# Patient Record
Sex: Male | Born: 1955 | Race: White | Hispanic: No | Marital: Married | Smoking: Never smoker
Health system: Southern US, Community
[De-identification: ages and names within clinical notes are randomized; demographics above are authoritative.]

## PROBLEM LIST (undated history)

## (undated) DIAGNOSIS — S0990XA Unspecified injury of head, initial encounter: Secondary | ICD-10-CM

## (undated) DIAGNOSIS — T8859XA Other complications of anesthesia, initial encounter: Secondary | ICD-10-CM

## (undated) DIAGNOSIS — J45909 Unspecified asthma, uncomplicated: Secondary | ICD-10-CM

## (undated) DIAGNOSIS — F419 Anxiety disorder, unspecified: Secondary | ICD-10-CM

## (undated) DIAGNOSIS — H8110 Benign paroxysmal vertigo, unspecified ear: Secondary | ICD-10-CM

## (undated) DIAGNOSIS — K219 Gastro-esophageal reflux disease without esophagitis: Secondary | ICD-10-CM

## (undated) DIAGNOSIS — Z87442 Personal history of urinary calculi: Secondary | ICD-10-CM

## (undated) DIAGNOSIS — I10 Essential (primary) hypertension: Secondary | ICD-10-CM

## (undated) DIAGNOSIS — L309 Dermatitis, unspecified: Secondary | ICD-10-CM

## (undated) DIAGNOSIS — M170 Bilateral primary osteoarthritis of knee: Secondary | ICD-10-CM

## (undated) DIAGNOSIS — E7800 Pure hypercholesterolemia, unspecified: Secondary | ICD-10-CM

## (undated) DIAGNOSIS — N529 Male erectile dysfunction, unspecified: Secondary | ICD-10-CM

## (undated) DIAGNOSIS — R413 Other amnesia: Secondary | ICD-10-CM

## (undated) DIAGNOSIS — J189 Pneumonia, unspecified organism: Secondary | ICD-10-CM

## (undated) DIAGNOSIS — D75839 Thrombocytosis, unspecified: Secondary | ICD-10-CM

## (undated) DIAGNOSIS — G4700 Insomnia, unspecified: Secondary | ICD-10-CM

## (undated) DIAGNOSIS — N400 Enlarged prostate without lower urinary tract symptoms: Secondary | ICD-10-CM

## (undated) HISTORY — PX: CHOLECYSTECTOMY: SHX55

## (undated) HISTORY — PX: KNEE SURGERY: SHX244

---

## 2020-07-26 ENCOUNTER — Encounter (HOSPITAL_COMMUNITY): Payer: Self-pay

## 2020-07-26 NOTE — Progress Notes (Addendum)
COVID Vaccine Completed:  x2 Date COVID Vaccine completed:  04-22-20 & 05-13-20 COVID vaccine manufacturer: Pfizer    Moderna   [employer]   PCP - Lynn Ito, MD Cardiologist -   Chest x-ray -  EKG -  Stress Test -  ECHO -  Cardiac Cath -  Pacemaker/ICD device last checked:  Sleep Study -  CPAP -   Fasting Blood Sugar -  Checks Blood Sugar _____ times a day  Blood Thinner Instructions: Aspirin Instructions:  ASA 81 mg Last Dose:  Anesthesia review:   Patient denies shortness of breath, fever, cough and chest pain at PAT appointment.  Patient able to climb a flight of stairs and perform ADL's without assistance.  Patient currently being treated for poison ivy, has red rash on bilateral arms.   Patient verbalized understanding of instructions that were given to them at the PAT appointment. Patient was also instructed that they will need to review over the PAT instructions again at home before surgery.

## 2020-07-26 NOTE — Patient Instructions (Addendum)
DUE TO COVID-19 ONLY ONE VISITOR IS ALLOWED TO COME WITH YOU AND STAY IN THE WAITING ROOM ONLY DURING PRE OP AND PROCEDURE.   IF YOU WILL BE ADMITTED INTO THE HOSPITAL YOU ARE ALLOWED ONE SUPPORT PERSON DURING VISITATION HOURS ONLY (10AM -8PM)   . The support person may change daily. . The support person must pass our screening, gel in and out, and wear a mask at all times, including in the patient's room. . Patients must also wear a mask when staff or their support person are in the room.   COVID SWAB TESTING MUST BE COMPLETED ON:  Thursday, 08-03-20 @ 9:00 AM   [address] [place], [place] [address]  (Must self quarantine after testing. Follow instructions on handout.)        Your procedure is scheduled on:  Monday, 08-07-20   Report to [facility] Main  Entrance    Report to admitting at 9:00 AM   Call this number if you have problems the morning of surgery [phone]   Do not eat food :After Midnight.   May have liquids until 8:30 AM day of surgery  CLEAR LIQUID DIET  Foods Allowed                                                                     Foods Excluded  Water, Black Coffee and tea, regular and decaf             liquids that you cannot  Plain Jell-O in any flavor  (No red)                                   see through such as: Fruit ices (not with fruit pulp)                                      milk, soups, orange juice              Iced Popsicles (No red)                                      All solid food                                   Apple juices Sports drinks like Gatorade (No red) Lightly seasoned clear broth or consume(fat free) Sugar, honey syrup    Complete one Ensure drink the morning of surgery at 8:30 AM   the day of surgery.    Oral Hygiene is also important to reduce your risk of infection.                                    Remember - BRUSH YOUR TEETH THE MORNING OF SURGERY WITH YOUR REGULAR TOOTHPASTE   Do NOT smoke after  Midnight   Take these medicines the morning of  surgery with A SIP OF WATER:  Gabapentin, Claritin, Ativan if needed.  Okay to use inhalers and             bring with you day of surgery.                               You may not have any metal on your body including jewelry, and body piercings             Do not wear  lotions, powders, perfumes/cologne, or deodorant             Men may shave face and neck.   Do not bring valuables to the hospital. [facility] IS NOT RESPONSIBLE   FOR VALUABLES.   Contacts, dentures or bridgework may not be worn into surgery.   Bring small overnight bag day of surgery.     Special Instructions: Bring a copy of your healthcare power of attorney and living will documents         the day of surgery if you haven't scanned them in before.              Please read over the following fact sheets you were given: IF YOU HAVE QUESTIONS ABOUT YOUR PRE OP INSTRUCTIONS PLEASE CALL [phone]   [facility] - Preparing for Surgery Before surgery, you can play an important role.  Because skin is not sterile, your skin needs to be as free of germs as possible.  You can reduce the number of germs on your skin by washing with CHG (chlorahexidine gluconate) soap before surgery.  CHG is an antiseptic cleaner which kills germs and bonds with the skin to continue killing germs even after washing. Please DO NOT use if you have an allergy to CHG or antibacterial soaps.  If your skin becomes reddened/irritated stop using the CHG and inform your nurse when you arrive at Short Stay. Do not shave (including legs and underarms) for at least 48 hours prior to the first CHG shower.  You may shave your face/neck.  Please follow these instructions carefully:  1.  Shower with CHG Soap the night before surgery and the  morning of surgery.  2.  If you choose to wash your hair, wash your hair first as usual with your normal  shampoo.  3.  After you shampoo, rinse your hair and body thoroughly  to remove the shampoo.                             4.  Use CHG as you would any other liquid soap.  You can apply chg directly to the skin and wash.  Gently with a scrungie or clean washcloth.  5.  Apply the CHG Soap to your body ONLY FROM THE NECK DOWN.   Do   not use on face/ open                           Wound or open sores. Avoid contact with eyes, ears mouth and   genitals (private parts).                       Wash face,  Genitals (private parts) with your normal soap.             6.  Wash thoroughly, paying special attention  to the area where your    surgery  will be performed.  7.  Thoroughly rinse your body with warm water from the neck down.  8.  DO NOT shower/wash with your normal soap after using and rinsing off the CHG Soap.                9.  Pat yourself dry with a clean towel.            10.  Wear clean pajamas.            11.  Place clean sheets on your bed the night of your first shower and do not  sleep with pets. Day of Surgery : Do not apply any lotions/deodorants the morning of surgery.  Please wear clean clothes to the hospital/surgery center.  FAILURE TO FOLLOW THESE INSTRUCTIONS MAY RESULT IN THE CANCELLATION OF YOUR SURGERY  PATIENT SIGNATURE_________________________________  NURSE SIGNATURE__________________________________  ________________________________________________________________________   Howard Morton  An incentive spirometer is a tool that can help keep your lungs clear and active. This tool measures how well you are filling your lungs with each breath. Taking long deep breaths may help reverse or decrease the chance of developing breathing (pulmonary) problems (especially infection) following:  A long period of time when you are unable to move or be active. BEFORE THE PROCEDURE   If the spirometer includes an indicator to show your best effort, your nurse or respiratory therapist will set it to a desired goal.  If possible, sit up  straight or lean slightly forward. Try not to slouch.  Hold the incentive spirometer in an upright position. INSTRUCTIONS FOR USE  1. Sit on the edge of your bed if possible, or sit up as far as you can in bed or on a chair. 2. Hold the incentive spirometer in an upright position. 3. Breathe out normally. 4. Place the mouthpiece in your mouth and seal your lips tightly around it. 5. Breathe in slowly and as deeply as possible, raising the piston or the ball toward the top of the column. 6. Hold your breath for 3-5 seconds or for as long as possible. Allow the piston or ball to fall to the bottom of the column. 7. Remove the mouthpiece from your mouth and breathe out normally. 8. Rest for a few seconds and repeat Steps 1 through 7 at least 10 times every 1-2 hours when you are awake. Take your time and take a few normal breaths between deep breaths. 9. The spirometer may include an indicator to show your best effort. Use the indicator as a goal to work toward during each repetition. 10. After each set of 10 deep breaths, practice coughing to be sure your lungs are clear. If you have an incision (the cut made at the time of surgery), support your incision when coughing by placing a pillow or rolled up towels firmly against it. Once you are able to get out of bed, walk around indoors and cough well. You may stop using the incentive spirometer when instructed by your caregiver.  RISKS AND COMPLICATIONS  Take your time so you do not get dizzy or light-headed.  If you are in pain, you may need to take or ask for pain medication before doing incentive spirometry. It is harder to take a deep breath if you are having pain. AFTER USE  Rest and breathe slowly and easily.  It can be helpful to keep track of a log of your progress. Your caregiver can  provide you with a simple table to help with this. If you are using the spirometer at home, follow these instructions: SEEK MEDICAL CARE IF:   You are  having difficultly using the spirometer.  You have trouble using the spirometer as often as instructed.  Your pain medication is not giving enough relief while using the spirometer.  You develop fever of 100.5 F (38.1 C) or higher. SEEK IMMEDIATE MEDICAL CARE IF:   You cough up bloody sputum that had not been present before.  You develop fever of 102 F (38.9 C) or greater.  You develop worsening pain at or near the incision site. MAKE SURE YOU:   Understand these instructions.  Will watch your condition.  Will get help right away if you are not doing well or get worse. Document Released: 12/30/2006 Document Revised: 11/11/2011 Document Reviewed: 03/02/2007 ExitCare Patient Information 2014 ExitCare, [place].   ________________________________________________________________________  WHAT IS A BLOOD TRANSFUSION? Blood Transfusion Information  A transfusion is the replacement of blood or some of its parts. Blood is made up of multiple cells which provide different functions.  Red blood cells carry oxygen and are used for blood loss replacement.  White blood cells fight against infection.  Platelets control bleeding.  Plasma helps clot blood.  Other blood products are available for specialized needs, such as hemophilia or other clotting disorders. BEFORE THE TRANSFUSION  Who gives blood for transfusions?   Healthy volunteers who are fully evaluated to make sure their blood is safe. This is blood bank blood. Transfusion therapy is the safest it has ever been in the practice of medicine. Before blood is taken from a donor, a complete history is taken to make sure that person has no history of diseases nor engages in risky social behavior (examples are intravenous drug use or sexual activity with multiple partners). The donor's travel history is screened to minimize risk of transmitting infections, such as malaria. The donated blood is tested for signs of infectious diseases,  such as HIV and hepatitis. The blood is then tested to be sure it is compatible with you in order to minimize the chance of a transfusion reaction. If you or a relative donates blood, this is often done in anticipation of surgery and is not appropriate for emergency situations. It takes many days to process the donated blood. RISKS AND COMPLICATIONS Although transfusion therapy is very safe and saves many lives, the main dangers of transfusion include:   Getting an infectious disease.  Developing a transfusion reaction. This is an allergic reaction to something in the blood you were given. Every precaution is taken to prevent this. The decision to have a blood transfusion has been considered carefully by your caregiver before blood is given. Blood is not given unless the benefits outweigh the risks. AFTER THE TRANSFUSION  Right after receiving a blood transfusion, you will usually feel much better and more energetic. This is especially true if your red blood cells have gotten low (anemic). The transfusion raises the level of the red blood cells which carry oxygen, and this usually causes an energy increase.  The nurse administering the transfusion will monitor you carefully for complications. HOME CARE INSTRUCTIONS  No special instructions are needed after a transfusion. You may find your energy is better. Speak with your caregiver about any limitations on activity for underlying diseases you may have. SEEK MEDICAL CARE IF:   Your condition is not improving after your transfusion.  You develop redness or irritation at the intravenous (  IV) site. SEEK IMMEDIATE MEDICAL CARE IF:  Any of the following symptoms occur over the next 12 hours:  Shaking chills.  You have a temperature by mouth above 102 F (38.9 C), not controlled by medicine.  Chest, back, or muscle pain.  People around you feel you are not acting correctly or are confused.  Shortness of breath or difficulty  breathing.  Dizziness and fainting.  You get a rash or develop hives.  You have a decrease in urine output.  Your urine turns a dark color or changes to pink, red, or brown. Any of the following symptoms occur over the next 10 days:  You have a temperature by mouth above 102 F (38.9 C), not controlled by medicine.  Shortness of breath.  Weakness after normal activity.  The white part of the eye turns yellow (jaundice).  You have a decrease in the amount of urine or are urinating less often.  Your urine turns a dark color or changes to pink, red, or brown. Document Released: 08/16/2000 Document Revised: 11/11/2011 Document Reviewed: 04/04/2008 [facility] Patient Information 2014 [place], [place].  _______________________________________________________________________

## 2020-07-31 ENCOUNTER — Encounter (HOSPITAL_COMMUNITY): Payer: Self-pay

## 2020-07-31 ENCOUNTER — Other Ambulatory Visit: Payer: Self-pay

## 2020-07-31 ENCOUNTER — Encounter (HOSPITAL_COMMUNITY)
Admission: RE | Admit: 2020-07-31 | Discharge: 2020-07-31 | Disposition: A | Discharge status: Home / Self Care | Payer: Medicare HMO | Source: Ambulatory Visit | Attending: Orthopedic Surgery | Admitting: Orthopedic Surgery

## 2020-07-31 DIAGNOSIS — Z01812 Encounter for preprocedural laboratory examination: Secondary | ICD-10-CM | POA: Diagnosis not present

## 2020-07-31 HISTORY — DX: Benign paroxysmal vertigo, unspecified ear: H81.10

## 2020-07-31 HISTORY — DX: Dermatitis, unspecified: L30.9

## 2020-07-31 HISTORY — DX: Other amnesia: R41.3

## 2020-07-31 HISTORY — DX: Unspecified injury of head, initial encounter: S09.90XA

## 2020-07-31 HISTORY — DX: Bilateral primary osteoarthritis of knee: M17.0

## 2020-07-31 HISTORY — DX: Anxiety disorder, unspecified: F41.9

## 2020-07-31 HISTORY — DX: Thrombocytosis, unspecified: D75.839

## 2020-07-31 HISTORY — DX: Male erectile dysfunction, unspecified: N52.9

## 2020-07-31 HISTORY — DX: Benign prostatic hyperplasia without lower urinary tract symptoms: N40.0

## 2020-07-31 HISTORY — DX: Pure hypercholesterolemia, unspecified: E78.00

## 2020-07-31 HISTORY — DX: Insomnia, unspecified: G47.00

## 2020-07-31 HISTORY — DX: Pneumonia, unspecified organism: J18.9

## 2020-07-31 HISTORY — DX: Unspecified asthma, uncomplicated: J45.909

## 2020-07-31 HISTORY — DX: Gastro-esophageal reflux disease without esophagitis: K21.9

## 2020-07-31 LAB — COMPREHENSIVE METABOLIC PANEL
ALT: 30 U/L (ref 0–44)
AST: 22 U/L (ref 15–41)
Albumin: 4.6 g/dL (ref 3.5–5.0)
Alkaline Phosphatase: 100 U/L (ref 38–126)
Anion gap: 11 (ref 5–15)
BUN: 22 mg/dL (ref 8–23)
CO2: 26 mmol/L (ref 22–32)
Calcium: 9.9 mg/dL (ref 8.9–10.3)
Chloride: 101 mmol/L (ref 98–111)
Creatinine, Ser: 0.99 mg/dL (ref 0.61–1.24)
GFR, Estimated: >60 mL/min (ref >60)
Glucose, Bld: 104 mg/dL — ABNORMAL HIGH (ref 70–99)
Potassium: 4.2 mmol/L (ref 3.5–5.1)
Sodium: 138 mmol/L (ref 135–145)
Total Bilirubin: 0.4 mg/dL (ref 0.3–1.2)
Total Protein: 8.1 g/dL (ref 6.5–8.1)

## 2020-07-31 LAB — CBC
HCT: 46.4 % (ref 39.0–52.0)
Hemoglobin: 15.2 g/dL (ref 13.0–17.0)
MCH: 28.7 pg (ref 26.0–34.0)
MCHC: 32.8 g/dL (ref 30.0–36.0)
MCV: 87.7 fL (ref 80.0–100.0)
Platelets: 391 10*3/uL (ref 150–400)
RBC: 5.29 MIL/uL (ref 4.22–5.81)
RDW: 13.2 % (ref 11.5–15.5)
WBC: 14.7 10*3/uL — ABNORMAL HIGH (ref 4.0–10.5)
nRBC: 0 % (ref 0.0–0.2)

## 2020-07-31 LAB — PROTIME-INR
INR: 1 (ref 0.8–1.2)
Prothrombin Time: 13.1 seconds (ref 11.4–15.2)

## 2020-07-31 LAB — APTT: aPTT: 28 seconds (ref 24–36)

## 2020-07-31 LAB — SURGICAL PCR SCREEN
MRSA, PCR: NEGATIVE
Staphylococcus aureus: NEGATIVE

## 2020-07-31 NOTE — Progress Notes (Signed)
CBC sent to Dr. Aluisio to review.  

## 2020-08-03 ENCOUNTER — Other Ambulatory Visit (HOSPITAL_COMMUNITY)
Admission: RE | Admit: 2020-08-03 | Discharge: 2020-08-03 | Disposition: A | Discharge status: Home / Self Care | Payer: Medicare HMO | Source: Ambulatory Visit | Attending: Orthopedic Surgery | Admitting: Orthopedic Surgery

## 2020-08-03 DIAGNOSIS — Z01812 Encounter for preprocedural laboratory examination: Secondary | ICD-10-CM | POA: Diagnosis present

## 2020-08-03 DIAGNOSIS — Z20822 Contact with and (suspected) exposure to covid-19: Secondary | ICD-10-CM | POA: Insufficient documentation

## 2020-08-03 LAB — SARS CORONAVIRUS 2 (TAT 6-24 HRS): SARS Coronavirus 2: NEGATIVE

## 2020-08-04 NOTE — H&P (Signed)
TOTAL KNEE ADMISSION H&P  Patient is being admitted for left total knee arthroplasty.  Subjective:  Chief Complaint: Left knee pain.  HPI: Howard Morton, 64 y.o. male has a history of pain and functional disability in the left knee due to arthritis and has failed non-surgical conservative treatments for greater than 12 weeks to include corticosteriod injections, viscosupplementation injections and activity modification. Onset of symptoms was gradual, starting several years ago with gradually worsening course since that time. The patient noted prior procedures on the knee to include  arthroscopy and menisectomy on the left knee.  Patient currently rates pain in the left knee at 7 out of 10 with activity. Patient has worsening of pain with activity and weight bearing, pain that interferes with activities of daily living and crepitus. Patient has evidence of bone-on-bone formation in the medial and patellofemoral compartments by imaging studies. There is no active infection.  There are no problems to display for this patient.   Past Medical History:  Diagnosis Date  . Anxiety   . Asthma   . Benign paroxysmal positional vertigo   . BPH (benign prostatic hyperplasia)   . CHI (closed head injury)   . Eczema   . ED (erectile dysfunction)   . GERD (gastroesophageal reflux disease)   . Hypercholesterolemia   . Insomnia   . Osteoarthritis of both knees   . Pneumonia   . Short-term memory loss   . Thrombocytosis     Past Surgical History:  Procedure Laterality Date  . CHOLECYSTECTOMY    . KNEE SURGERY      Prior to Admission medications   Medication Sig Start Date End Date Taking? Authorizing Provider  albuterol (VENTOLIN HFA) 108 (90 Base) MCG/ACT inhaler Inhale 2 puffs into the lungs every 6 (six) hours as needed for wheezing or shortness of breath.   Yes [provider]  aspirin EC 81 MG tablet Take 81 mg by mouth daily. Swallow whole.   Yes [provider]    Cholecalciferol (VITAMIN D3 PO) Take 1 capsule by mouth daily.   Yes [provider]  diclofenac Sodium (VOLTAREN) 1 % GEL Apply 2 g topically 4 (four) times daily.   Yes [provider]  gabapentin (NEURONTIN) 300 MG capsule Take 600 mg by mouth 2 (two) times daily. 06/05/20  Yes [provider]  hydrocortisone 2.5 % cream Apply 1 application topically 2 (two) times daily as needed for itching. 03/07/20  Yes [provider]  loratadine (CLARITIN) 10 MG tablet Take 10 mg by mouth daily.   Yes [provider]  LORazepam (ATIVAN) 1 MG tablet Take 1 mg by mouth daily as needed for anxiety. 12/23/19  Yes [provider]  Multiple Vitamin (MULTIVITAMIN WITH MINERALS) TABS tablet Take 1 tablet by mouth daily.   Yes [provider]  Multiple Vitamins-Minerals (ZINC PO) Take 1 tablet by mouth daily.   Yes [provider]  naproxen sodium (ALEVE) 220 MG tablet Take 440 mg by mouth daily as needed (pain).   Yes [provider]  traMADol (ULTRAM) 50 MG tablet Take 100 mg by mouth 2 (two) times daily as needed for moderate pain.  04/27/20  Yes [provider]  Monte Fantasia INHUB 100-50 MCG/DOSE AEPB Inhale 1 puff into the lungs 2 (two) times daily. 06/13/20  Yes [provider]    Allergies  Allergen Reactions  . Valproic Acid Rash    Rash    Social History   Socioeconomic History  . Marital status:  Married    Spouse name: Not on file  . Number of children: Not on file  . Years of education: Not on file  . Highest education level: Not on file  Occupational History  . Not on file  Tobacco Use  . Smoking status: Never Smoker  . Smokeless tobacco: Never Used  Vaping Use  . Vaping Use: Never used  Substance and Sexual Activity  . Alcohol use: Yes    Comment: Rarely  . Drug use: Not Currently  . Sexual activity: Not on file  Other Topics Concern  . Not on file  Social History Narrative  . Not on file    Social Determinants of Health   Financial Resource Strain:   . Difficulty of Paying Living Expenses: Not on file  Food Insecurity:   . Worried About Programme researcher, broadcasting/film/video in the Last Year: Not on file  . Ran Out of Food in the Last Year: Not on file  Transportation Needs:   . Lack of Transportation (Medical): Not on file  . Lack of Transportation (Non-Medical): Not on file  Physical Activity:   . Days of Exercise per Week: Not on file  . Minutes of Exercise per Session: Not on file  Stress:   . Feeling of Stress : Not on file  Social Connections:   . Frequency of Communication with Friends and Family: Not on file  . Frequency of Social Gatherings with Friends and Family: Not on file  . Attends Religious Services: Not on file  . Active Member of Clubs or Organizations: Not on file  . Attends Banker Meetings: Not on file  . Marital Status: Not on file  Intimate Partner Violence:   . Fear of Current or Ex-Partner: Not on file  . Emotionally Abused: Not on file  . Physically Abused: Not on file  . Sexually Abused: Not on file      Tobacco Use: Low Risk   . Smoking Tobacco Use: Never Smoker  . Smokeless Tobacco Use: Never Used   Social History   Substance and Sexual Activity  Alcohol Use Yes   Comment: Rarely    No family history on file.  Review of Systems  Constitutional: Negative for chills and fever.  HENT: Negative for congestion, sore throat and tinnitus.   Eyes: Negative for double vision, photophobia and pain.  Respiratory: Negative for cough, shortness of breath and wheezing.   Cardiovascular: Negative for chest pain, palpitations and orthopnea.  Gastrointestinal: Negative for heartburn, nausea and vomiting.  Genitourinary: Negative for dysuria, frequency and urgency.  Musculoskeletal: Positive for joint pain.  Neurological: Negative for dizziness, weakness and headaches.    Objective:  Physical Exam: Well nourished and well developed.   General: Alert and oriented x3, cooperative and pleasant, no acute distress.  Head: normocephalic, atraumatic, neck supple.  Eyes: EOMI.  Respiratory: breath sounds clear in all fields, no wheezing, rales, or rhonchi. Cardiovascular: Regular rate and rhythm, no murmurs, gallops or rubs.  Abdomen: non-tender to palpation and soft, normoactive bowel sounds. Musculoskeletal:  Left Knee Exam:  No effusion present. No swelling present.  The range of motion is: 0 to 125 degrees.  Moderate crepitus on range of motion of the knee.  Positive medial joint line tenderness.  No lateral joint line tenderness.  The knee is stable.   Calves soft and nontender. Motor function intact in LE. Strength 5/5 LE bilaterally. Neuro: Distal pulses 2+. Sensation to light touch intact in LE.  Imaging Review  Plain radiographs demonstrate severe degenerative joint disease of the left knee. The overall alignment is neutral. The bone quality appears to be adequate for age and reported activity level.  Assessment/Plan:  End stage arthritis, left knee   The patient history, physical examination, clinical judgment of the provider and imaging studies are consistent with end stage degenerative joint disease of the left knee and total knee arthroplasty is deemed medically necessary. The treatment options including medical management, injection therapy arthroscopy and arthroplasty were discussed at length. The risks and benefits of total knee arthroplasty were presented and reviewed. The risks due to aseptic loosening, infection, stiffness, patella tracking problems, thromboembolic complications and other imponderables were discussed. The patient acknowledged the explanation, agreed to proceed with the plan and consent was signed. Patient is being admitted for inpatient treatment for surgery, pain control, PT, OT, prophylactic antibiotics, VTE prophylaxis, progressive ambulation and ADLs and discharge planning. The patient is  planning to be discharged home.   Patient's anticipated LOS is less than 2 midnights, meeting these requirements: - Younger than 84 - Lives within 1 hour of care - Has a competent adult at home to recover with post-op recover - NO history of  - Chronic pain requiring opiods  - Diabetes  - Coronary Artery Disease  - Heart failure  - Heart attack  - Stroke  - DVT/VTE  - Cardiac arrhythmia  - Respiratory Failure/COPD  - Renal failure  - Anemia  - Advanced Liver disease  Therapy Plans: Outpatient therapy at [facility] ([place]) Disposition: Home with wife Planned DVT Prophylaxis: Aspirin 325 mg BID DME Needed: None PCP: Lynn Ito, MD (appt 11/10) TXA: IV Allergies: Depakote (rash) Anesthesia Concerns: None BMI: 27.5 Last HgbA1c: Not diabetic.  Pharmacy: [facility] ([place])  Other: - Hx brain injury - Injection right knee intraop  - Patient was instructed on what medications to stop prior to surgery. - Follow-up visit in 2 weeks with Dr. Lequita Halt - Begin physical therapy following surgery - Pre-operative lab work as pre-surgical testing - Prescriptions will be provided in hospital at time of discharge  Arther Abbott, PA-C Orthopedic Surgery [facility]

## 2020-08-06 MED ORDER — BUPIVACAINE LIPOSOME 1.3 % IJ SUSP
20.0000 mL | Freq: Once | INTRAMUSCULAR | Status: DC
  Filled 2020-08-06: qty 20

## 2020-08-06 NOTE — Anesthesia Preprocedure Evaluation (Addendum)
Anesthesia Evaluation  Patient identified by MRN, date of birth, ID band Patient awake    Reviewed: Allergy & Precautions, H&P , NPO status , Patient's Chart, lab work & pertinent test results  Airway Mallampati: II  TM Distance: >3 FB Neck ROM: Full    Dental no notable dental hx. (+) Teeth Intact, Dental Advisory Given   Pulmonary asthma ,    Pulmonary exam normal breath sounds clear to auscultation       Cardiovascular Exercise Tolerance: Good negative cardio ROS   Rhythm:Regular Rate:Normal     Neuro/Psych Anxiety negative neurological ROS     GI/Hepatic Neg liver ROS, GERD  ,  Endo/Other  negative endocrine ROS  Renal/GU negative Renal ROS  negative genitourinary   Musculoskeletal  (+) Arthritis , Osteoarthritis,    Abdominal   Peds  Hematology negative hematology ROS (+)   Anesthesia Other Findings   Reproductive/Obstetrics negative OB ROS                            Anesthesia Physical Anesthesia Plan  ASA: II  Anesthesia Plan: MAC and Spinal   Post-op Pain Management:  Regional for Post-op pain   Induction: Intravenous  PONV Risk Score and Plan: 2 and Ondansetron, Dexamethasone, Propofol infusion and Midazolam  Airway Management Planned: Simple Face Mask  Additional Equipment:   Intra-op Plan:   Post-operative Plan:   Informed Consent: I have reviewed the patients History and Physical, chart, labs and discussed the procedure including the risks, benefits and alternatives for the proposed anesthesia with the patient or authorized representative who has indicated his/her understanding and acceptance.     Dental advisory given  Plan Discussed with: CRNA  Anesthesia Plan Comments:        Anesthesia Quick Evaluation

## 2020-08-07 ENCOUNTER — Observation Stay (HOSPITAL_COMMUNITY)
Admission: RE | Admit: 2020-08-07 | Discharge: 2020-08-08 | Disposition: A | Discharge status: Home / Self Care | Payer: Medicare HMO | Attending: Orthopedic Surgery | Admitting: Orthopedic Surgery

## 2020-08-07 ENCOUNTER — Encounter (HOSPITAL_COMMUNITY): Payer: Self-pay | Admitting: Orthopedic Surgery

## 2020-08-07 ENCOUNTER — Ambulatory Visit (HOSPITAL_COMMUNITY): Payer: Medicare HMO | Admitting: Anesthesiology

## 2020-08-07 ENCOUNTER — Other Ambulatory Visit: Payer: Self-pay

## 2020-08-07 ENCOUNTER — Encounter (HOSPITAL_COMMUNITY)
Admission: RE | Disposition: A | Discharge status: Home / Self Care | Payer: Self-pay | Source: Home / Self Care | Attending: Orthopedic Surgery

## 2020-08-07 DIAGNOSIS — M179 Osteoarthritis of knee, unspecified: Secondary | ICD-10-CM | POA: Diagnosis present

## 2020-08-07 DIAGNOSIS — J45909 Unspecified asthma, uncomplicated: Secondary | ICD-10-CM | POA: Diagnosis not present

## 2020-08-07 DIAGNOSIS — Z7951 Long term (current) use of inhaled steroids: Secondary | ICD-10-CM | POA: Diagnosis not present

## 2020-08-07 DIAGNOSIS — M1712 Unilateral primary osteoarthritis, left knee: Principal | ICD-10-CM | POA: Diagnosis present

## 2020-08-07 DIAGNOSIS — M1710 Unilateral primary osteoarthritis, unspecified knee: Secondary | ICD-10-CM | POA: Diagnosis present

## 2020-08-07 HISTORY — PX: TOTAL KNEE ARTHROPLASTY: SHX125

## 2020-08-07 LAB — TYPE AND SCREEN
ABO/RH(D): B NEG
Antibody Screen: NEGATIVE

## 2020-08-07 LAB — ABO/RH: ABO/RH(D): B NEG

## 2020-08-07 SURGERY — ARTHROPLASTY, KNEE, TOTAL
Anesthesia: Monitor Anesthesia Care | Site: Knee | Laterality: Left

## 2020-08-07 MED ORDER — POLYETHYLENE GLYCOL 3350 17 G PO PACK: 17.0000 g | PACK | Freq: Every day | ORAL | Status: DC | PRN

## 2020-08-07 MED ORDER — TRANEXAMIC ACID-NACL 1000-0.7 MG/100ML-% IV SOLN
1000.0000 mg | INTRAVENOUS | Status: AC
  Administered 2020-08-07: 1000 mg via INTRAVENOUS
  Filled 2020-08-07: qty 100

## 2020-08-07 MED ORDER — DEXAMETHASONE SODIUM PHOSPHATE 10 MG/ML IJ SOLN
INTRAMUSCULAR | Status: AC
  Filled 2020-08-07: qty 1

## 2020-08-07 MED ORDER — METHYLPREDNISOLONE ACETATE 40 MG/ML IJ SUSP
INTRAMUSCULAR | Status: AC
  Filled 2020-08-07: qty 1

## 2020-08-07 MED ORDER — HYDROMORPHONE HCL 1 MG/ML IJ SOLN: 0.2500 mg | INTRAMUSCULAR | Status: DC | PRN

## 2020-08-07 MED ORDER — DEXAMETHASONE SODIUM PHOSPHATE 10 MG/ML IJ SOLN
8.0000 mg | Freq: Once | INTRAMUSCULAR | Status: AC
  Administered 2020-08-07: 8 mg via INTRAVENOUS

## 2020-08-07 MED ORDER — ALBUTEROL SULFATE HFA 108 (90 BASE) MCG/ACT IN AERS: 2.0000 | INHALATION_SPRAY | Freq: Four times a day (QID) | RESPIRATORY_TRACT | Status: DC | PRN

## 2020-08-07 MED ORDER — LORAZEPAM 1 MG PO TABS: 1.0000 mg | ORAL_TABLET | Freq: Every day | ORAL | Status: DC | PRN

## 2020-08-07 MED ORDER — SODIUM CHLORIDE (PF) 0.9 % IJ SOLN
INTRAMUSCULAR | Status: AC
  Filled 2020-08-07: qty 10

## 2020-08-07 MED ORDER — CHLORHEXIDINE GLUCONATE 0.12 % MT SOLN
15.0000 mL | Freq: Once | OROMUCOSAL | Status: AC
  Administered 2020-08-07: 15 mL via OROMUCOSAL

## 2020-08-07 MED ORDER — ORAL CARE MOUTH RINSE: 15.0000 mL | Freq: Once | OROMUCOSAL | Status: AC

## 2020-08-07 MED ORDER — PROPOFOL 500 MG/50ML IV EMUL
INTRAVENOUS | Status: AC
  Filled 2020-08-07: qty 50

## 2020-08-07 MED ORDER — TRAMADOL HCL 50 MG PO TABS: 50.0000 mg | ORAL_TABLET | Freq: Four times a day (QID) | ORAL | Status: DC | PRN

## 2020-08-07 MED ORDER — LIDOCAINE 1% INJECTION FOR CIRCUMCISION
2.0000 mL | INJECTION | Freq: Once | INTRAVENOUS | Status: DC
  Filled 2020-08-07: qty 2

## 2020-08-07 MED ORDER — MORPHINE SULFATE (PF) 2 MG/ML IV SOLN: 1.0000 mg | INTRAVENOUS | Status: DC | PRN

## 2020-08-07 MED ORDER — METOCLOPRAMIDE HCL 5 MG PO TABS: 5.0000 mg | ORAL_TABLET | Freq: Three times a day (TID) | ORAL | Status: DC | PRN

## 2020-08-07 MED ORDER — ONDANSETRON HCL 4 MG/2ML IJ SOLN: 4.0000 mg | Freq: Four times a day (QID) | INTRAMUSCULAR | Status: DC | PRN

## 2020-08-07 MED ORDER — DOCUSATE SODIUM 100 MG PO CAPS
100.0000 mg | ORAL_CAPSULE | Freq: Two times a day (BID) | ORAL | Status: DC
  Administered 2020-08-07 – 2020-08-08 (×2): 100 mg via ORAL
  Filled 2020-08-07 (×2): qty 1

## 2020-08-07 MED ORDER — SODIUM CHLORIDE 0.9 % IV SOLN: INTRAVENOUS | Status: DC

## 2020-08-07 MED ORDER — DIPHENHYDRAMINE HCL 12.5 MG/5ML PO ELIX: 12.5000 mg | ORAL_SOLUTION | ORAL | Status: DC | PRN

## 2020-08-07 MED ORDER — METHYLPREDNISOLONE ACETATE 80 MG/ML IJ SUSP
80.0000 mg | Freq: Once | INTRAMUSCULAR | Status: DC
  Filled 2020-08-07: qty 1

## 2020-08-07 MED ORDER — MENTHOL 3 MG MT LOZG: 1.0000 | LOZENGE | OROMUCOSAL | Status: DC | PRN

## 2020-08-07 MED ORDER — ACETAMINOPHEN 500 MG PO TABS
1000.0000 mg | ORAL_TABLET | Freq: Four times a day (QID) | ORAL | Status: AC
  Administered 2020-08-07 – 2020-08-08 (×4): 1000 mg via ORAL
  Filled 2020-08-07 (×4): qty 2

## 2020-08-07 MED ORDER — LACTATED RINGERS IV SOLN: INTRAVENOUS | Status: DC

## 2020-08-07 MED ORDER — LORATADINE 10 MG PO TABS
10.0000 mg | ORAL_TABLET | Freq: Every day | ORAL | Status: DC
  Administered 2020-08-08: 10 mg via ORAL
  Filled 2020-08-07: qty 1

## 2020-08-07 MED ORDER — MORPHINE SULFATE (PF) 4 MG/ML IV SOLN: 0.5000 mg | INTRAVENOUS | Status: DC | PRN

## 2020-08-07 MED ORDER — LIDOCAINE HCL (PF) 1 % IJ SOLN
INTRAMUSCULAR | Status: AC
  Filled 2020-08-07: qty 30

## 2020-08-07 MED ORDER — METHOCARBAMOL 500 MG IVPB - SIMPLE MED
500.0000 mg | Freq: Four times a day (QID) | INTRAVENOUS | Status: DC | PRN
  Filled 2020-08-07: qty 50

## 2020-08-07 MED ORDER — BUPIVACAINE IN DEXTROSE 0.75-8.25 % IT SOLN
INTRATHECAL | Status: DC | PRN
  Administered 2020-08-07: 1.6 mL via INTRATHECAL

## 2020-08-07 MED ORDER — ONDANSETRON HCL 4 MG/2ML IJ SOLN
INTRAMUSCULAR | Status: AC
  Filled 2020-08-07: qty 2

## 2020-08-07 MED ORDER — ACETAMINOPHEN 10 MG/ML IV SOLN
1000.0000 mg | Freq: Four times a day (QID) | INTRAVENOUS | Status: DC
  Administered 2020-08-07: 1000 mg via INTRAVENOUS
  Filled 2020-08-07 (×2): qty 100

## 2020-08-07 MED ORDER — PROPOFOL 500 MG/50ML IV EMUL
INTRAVENOUS | Status: DC | PRN
  Administered 2020-08-07: 75 ug/kg/min via INTRAVENOUS

## 2020-08-07 MED ORDER — CEFAZOLIN SODIUM-DEXTROSE 2-4 GM/100ML-% IV SOLN
2.0000 g | Freq: Four times a day (QID) | INTRAVENOUS | Status: AC
  Administered 2020-08-07 (×2): 2 g via INTRAVENOUS
  Filled 2020-08-07 (×2): qty 100

## 2020-08-07 MED ORDER — ONDANSETRON HCL 4 MG PO TABS: 4.0000 mg | ORAL_TABLET | Freq: Four times a day (QID) | ORAL | Status: DC | PRN

## 2020-08-07 MED ORDER — STERILE WATER FOR IRRIGATION IR SOLN
Status: DC | PRN
  Administered 2020-08-07: 1000 mL

## 2020-08-07 MED ORDER — PHENYLEPHRINE 40 MCG/ML (10ML) SYRINGE FOR IV PUSH (FOR BLOOD PRESSURE SUPPORT)
PREFILLED_SYRINGE | INTRAVENOUS | Status: DC | PRN
  Administered 2020-08-07 (×2): 80 ug via INTRAVENOUS

## 2020-08-07 MED ORDER — METOCLOPRAMIDE HCL 5 MG/ML IJ SOLN: 5.0000 mg | Freq: Three times a day (TID) | INTRAMUSCULAR | Status: DC | PRN

## 2020-08-07 MED ORDER — FLEET ENEMA 7-19 GM/118ML RE ENEM: 1.0000 | ENEMA | Freq: Once | RECTAL | Status: DC | PRN

## 2020-08-07 MED ORDER — CEFAZOLIN SODIUM-DEXTROSE 2-4 GM/100ML-% IV SOLN
2.0000 g | INTRAVENOUS | Status: AC
  Administered 2020-08-07: 2 g via INTRAVENOUS
  Filled 2020-08-07: qty 100

## 2020-08-07 MED ORDER — POVIDONE-IODINE 10 % EX SWAB
2.0000 "application " | Freq: Once | CUTANEOUS | Status: AC
  Administered 2020-08-07: 2 via TOPICAL

## 2020-08-07 MED ORDER — GABAPENTIN 300 MG PO CAPS
600.0000 mg | ORAL_CAPSULE | Freq: Two times a day (BID) | ORAL | Status: DC
  Administered 2020-08-07 – 2020-08-08 (×2): 600 mg via ORAL
  Filled 2020-08-07 (×2): qty 2

## 2020-08-07 MED ORDER — MIDAZOLAM HCL 2 MG/2ML IJ SOLN
1.0000 mg | INTRAMUSCULAR | Status: DC
  Administered 2020-08-07: 1 mg via INTRAVENOUS
  Filled 2020-08-07: qty 2

## 2020-08-07 MED ORDER — FENTANYL CITRATE (PF) 100 MCG/2ML IJ SOLN
50.0000 ug | INTRAMUSCULAR | Status: DC
  Administered 2020-08-07: 50 ug via INTRAVENOUS
  Filled 2020-08-07: qty 2

## 2020-08-07 MED ORDER — DEXAMETHASONE SODIUM PHOSPHATE 10 MG/ML IJ SOLN
10.0000 mg | Freq: Once | INTRAMUSCULAR | Status: AC
  Administered 2020-08-08: 10 mg via INTRAVENOUS
  Filled 2020-08-07: qty 1

## 2020-08-07 MED ORDER — BUPIVACAINE-EPINEPHRINE (PF) 0.5% -1:200000 IJ SOLN
INTRAMUSCULAR | Status: DC | PRN
  Administered 2020-08-07: 20 mL via PERINEURAL

## 2020-08-07 MED ORDER — SODIUM CHLORIDE 0.9 % IR SOLN
Status: DC | PRN
  Administered 2020-08-07: 1

## 2020-08-07 MED ORDER — OXYCODONE HCL 5 MG PO TABS
5.0000 mg | ORAL_TABLET | ORAL | Status: DC | PRN
  Administered 2020-08-07 – 2020-08-08 (×2): 10 mg via ORAL
  Filled 2020-08-07 (×3): qty 2

## 2020-08-07 MED ORDER — ASPIRIN EC 325 MG PO TBEC
325.0000 mg | DELAYED_RELEASE_TABLET | Freq: Two times a day (BID) | ORAL | Status: DC
  Administered 2020-08-08: 325 mg via ORAL
  Filled 2020-08-07: qty 1

## 2020-08-07 MED ORDER — METHOCARBAMOL 500 MG PO TABS: 500.0000 mg | ORAL_TABLET | Freq: Four times a day (QID) | ORAL | Status: DC | PRN

## 2020-08-07 MED ORDER — LIDOCAINE HCL (PF) 1 % IJ SOLN
2.0000 mL | Freq: Once | INTRAMUSCULAR | Status: DC
  Filled 2020-08-07: qty 2

## 2020-08-07 MED ORDER — PHENOL 1.4 % MT LIQD: 1.0000 | OROMUCOSAL | Status: DC | PRN

## 2020-08-07 MED ORDER — SODIUM CHLORIDE (PF) 0.9 % IJ SOLN
INTRAMUSCULAR | Status: AC
  Filled 2020-08-07: qty 50

## 2020-08-07 MED ORDER — PROPOFOL 10 MG/ML IV BOLUS
INTRAVENOUS | Status: DC | PRN
  Administered 2020-08-07: 20 mg via INTRAVENOUS

## 2020-08-07 MED ORDER — ONDANSETRON HCL 4 MG/2ML IJ SOLN
INTRAMUSCULAR | Status: DC | PRN
  Administered 2020-08-07: 4 mg via INTRAVENOUS

## 2020-08-07 MED ORDER — PHENYLEPHRINE 40 MCG/ML (10ML) SYRINGE FOR IV PUSH (FOR BLOOD PRESSURE SUPPORT)
PREFILLED_SYRINGE | INTRAVENOUS | Status: AC
  Filled 2020-08-07: qty 10

## 2020-08-07 MED ORDER — SODIUM CHLORIDE (PF) 0.9 % IJ SOLN
INTRAMUSCULAR | Status: DC | PRN
  Administered 2020-08-07: 60 mL

## 2020-08-07 MED ORDER — BUPIVACAINE LIPOSOME 1.3 % IJ SUSP
INTRAMUSCULAR | Status: DC | PRN
  Administered 2020-08-07: 20 mL

## 2020-08-07 MED ORDER — BISACODYL 10 MG RE SUPP: 10.0000 mg | Freq: Every day | RECTAL | Status: DC | PRN

## 2020-08-07 MED ORDER — 0.9 % SODIUM CHLORIDE (POUR BTL) OPTIME
TOPICAL | Status: DC | PRN
  Administered 2020-08-07: 1000 mL

## 2020-08-07 SURGICAL SUPPLY — 60 items
ATTUNE MED DOME PAT 41 KNEE (Knees) ×2 IMPLANT
ATTUNE MED DOME PAT 41MM KNEE (Knees) ×1 IMPLANT
ATTUNE PS FEM LT SZ 8 CEM KNEE (Femur) ×3 IMPLANT
ATTUNE PSRP INSR SZ8 8 KNEE (Insert) ×2 IMPLANT
ATTUNE PSRP INSR SZ8 8MM KNEE (Insert) ×1 IMPLANT
BAG ZIPLOCK 12X15 (MISCELLANEOUS) ×3 IMPLANT
BASE TIBIAL ROT PLAT SZ 8 KNEE (Knees) ×1 IMPLANT
BLADE SAG 18X100X1.27 (BLADE) ×3 IMPLANT
BLADE SAW SGTL 11.0X1.19X90.0M (BLADE) ×3 IMPLANT
BLADE SURG SZ10 CARB STEEL (BLADE) ×6 IMPLANT
BNDG ELASTIC 6X10 VLCR STRL LF (GAUZE/BANDAGES/DRESSINGS) ×3 IMPLANT
BNDG ELASTIC 6X5.8 VLCR STR LF (GAUZE/BANDAGES/DRESSINGS) ×3 IMPLANT
BOWL SMART MIX CTS (DISPOSABLE) ×3 IMPLANT
CEMENT HV SMART SET (Cement) ×6 IMPLANT
CLOSURE STERI-STRIP 1/2X4 (GAUZE/BANDAGES/DRESSINGS) ×1
CLOSURE WOUND 1/2 X4 (GAUZE/BANDAGES/DRESSINGS) ×2
CLSR STERI-STRIP ANTIMIC 1/2X4 (GAUZE/BANDAGES/DRESSINGS) ×2 IMPLANT
COVER SURGICAL LIGHT HANDLE (MISCELLANEOUS) ×3 IMPLANT
COVER WAND RF STERILE (DRAPES) IMPLANT
CUFF TOURN SGL QUICK 34 (TOURNIQUET CUFF) ×2
CUFF TRNQT CYL 34X4.125X (TOURNIQUET CUFF) ×1 IMPLANT
DECANTER SPIKE VIAL GLASS SM (MISCELLANEOUS) ×3 IMPLANT
DRAPE U-SHAPE 47X51 STRL (DRAPES) ×3 IMPLANT
DRSG AQUACEL AG ADV 3.5X10 (GAUZE/BANDAGES/DRESSINGS) ×3 IMPLANT
DRSG AQUACEL AG ADV 3.5X14 (GAUZE/BANDAGES/DRESSINGS) ×3 IMPLANT
DURAPREP 26ML APPLICATOR (WOUND CARE) ×3 IMPLANT
ELECT REM PT RETURN 15FT ADLT (MISCELLANEOUS) ×3 IMPLANT
GLOVE BIO SURGEON STRL SZ7 (GLOVE) ×3 IMPLANT
GLOVE BIO SURGEON STRL SZ8 (GLOVE) ×3 IMPLANT
GLOVE BIOGEL PI IND STRL 7.0 (GLOVE) ×1 IMPLANT
GLOVE BIOGEL PI IND STRL 8 (GLOVE) ×1 IMPLANT
GLOVE BIOGEL PI INDICATOR 7.0 (GLOVE) ×2
GLOVE BIOGEL PI INDICATOR 8 (GLOVE) ×2
GOWN STRL REUS W/TWL LRG LVL3 (GOWN DISPOSABLE) ×6 IMPLANT
HANDPIECE INTERPULSE COAX TIP (DISPOSABLE) ×2
HOLDER FOLEY CATH W/STRAP (MISCELLANEOUS) IMPLANT
IMMOBILIZER KNEE 20 (SOFTGOODS) ×3
IMMOBILIZER KNEE 20 THIGH 36 (SOFTGOODS) ×1 IMPLANT
KIT TURNOVER KIT A (KITS) IMPLANT
MANIFOLD NEPTUNE II (INSTRUMENTS) ×3 IMPLANT
NS IRRIG 1000ML POUR BTL (IV SOLUTION) ×3 IMPLANT
PACK TOTAL KNEE CUSTOM (KITS) ×3 IMPLANT
PADDING CAST ABS 6INX4YD NS (CAST SUPPLIES) ×2
PADDING CAST ABS COTTON 6X4 NS (CAST SUPPLIES) ×1 IMPLANT
PADDING CAST COTTON 6X4 STRL (CAST SUPPLIES) ×6 IMPLANT
PENCIL SMOKE EVACUATOR (MISCELLANEOUS) ×3 IMPLANT
PIN DRILL FIX HALF THREAD (BIT) ×3 IMPLANT
PIN STEINMAN FIXATION KNEE (PIN) ×3 IMPLANT
PROTECTOR NERVE ULNAR (MISCELLANEOUS) ×3 IMPLANT
SET HNDPC FAN SPRY TIP SCT (DISPOSABLE) ×1 IMPLANT
STRIP CLOSURE SKIN 1/2X4 (GAUZE/BANDAGES/DRESSINGS) ×4 IMPLANT
SUT MNCRL AB 4-0 PS2 18 (SUTURE) ×3 IMPLANT
SUT STRATAFIX 0 PDS 27 VIOLET (SUTURE) ×3
SUT VIC AB 2-0 CT1 27 (SUTURE) ×6
SUT VIC AB 2-0 CT1 TAPERPNT 27 (SUTURE) ×3 IMPLANT
SUTURE STRATFX 0 PDS 27 VIOLET (SUTURE) ×1 IMPLANT
TIBIAL BASE ROT PLAT SZ 8 KNEE (Knees) ×3 IMPLANT
TRAY FOLEY MTR SLVR 16FR STAT (SET/KITS/TRAYS/PACK) ×3 IMPLANT
WATER STERILE IRR 1000ML POUR (IV SOLUTION) ×6 IMPLANT
WRAP KNEE MAXI GEL POST OP (GAUZE/BANDAGES/DRESSINGS) ×3 IMPLANT

## 2020-08-07 NOTE — Interval H&P Note (Signed)
History and Physical Interval Note:  08/07/2020 9:51 AM  Howard Morton  has presented today for surgery, with the diagnosis of left knee osteoarthritis.  The various methods of treatment have been discussed with the patient and family. After consideration of risks, benefits and other options for treatment, the patient has consented to  Procedure(s) with comments: TOTAL KNEE ARTHROPLASTY (Left) - as a surgical intervention.  The patient's history has been reviewed, patient examined, no change in status, stable for surgery.  I have reviewed the patient's chart and labs.  Questions were answered to the patient's satisfaction.     Homero Fellers [name]

## 2020-08-07 NOTE — Anesthesia Postprocedure Evaluation (Signed)
Anesthesia Post Note  Patient: Crespin Forstrom  Procedure(s) Performed: TOTAL KNEE ARTHROPLASTY (Left Knee)     Patient location during evaluation: PACU Anesthesia Type: MAC, Spinal and Regional Level of consciousness: oriented and awake and alert Pain management: pain level controlled Vital Signs Assessment: post-procedure vital signs reviewed and stable Respiratory status: spontaneous breathing and respiratory function stable Cardiovascular status: blood pressure returned to baseline and stable Postop Assessment: no headache, no backache, no apparent nausea or vomiting, spinal receding and patient able to bend at knees Anesthetic complications: no   No complications documented.  Last Vitals:  Vitals:   08/07/20 1430 08/07/20 1445  BP: 137/84 (!) 141/81  Pulse: 66 70  Resp: 16 (!) 7  Temp:  (!) 36.2 C  SpO2: 99% 100%    Last Pain:  Vitals:   08/07/20 1445  TempSrc:   PainSc: 0-No pain                 Vedant Shehadeh,W. EDMOND

## 2020-08-07 NOTE — Discharge Instructions (Signed)
 Howard Aluisio, MD Total Joint Specialist [facility] [address] [place], [place] [address] [phone]  TOTAL KNEE REPLACEMENT POSTOPERATIVE DIRECTIONS    Knee Rehabilitation, Guidelines Following Surgery  Results after knee surgery are often greatly improved when you follow the exercise, range of motion and muscle strengthening exercises prescribed by your doctor. Safety measures are also important to protect the knee from further injury. If any of these exercises cause you to have increased pain or swelling in your knee joint, decrease the amount until you are comfortable again and slowly increase them. If you have problems or questions, call your caregiver or physical therapist for advice.   BLOOD CLOT PREVENTION . Take a 325 mg Aspirin two times a day for three weeks following surgery. Then resume one 81 mg Aspirin once a day. . You may resume your vitamins/supplements upon discharge from the hospital. . Do not take any NSAIDs (Advil, Aleve, Ibuprofen, Meloxicam, etc.) until you have discontinued the 325 mg Aspirin.  HOME CARE INSTRUCTIONS  . Remove items at home which could result in a fall. This includes throw rugs or furniture in walking pathways.  . ICE to the affected knee as much as tolerated. Icing helps control swelling. If the swelling is well controlled you will be more comfortable and rehab easier. Continue to use ice on the knee for pain and swelling from surgery. You may notice swelling that will progress down to the foot and ankle. This is normal after surgery. Elevate the leg when you are not up walking on it.    . Continue to use the breathing machine which will help keep your temperature down. It is common for your temperature to cycle up and down following surgery, especially at night when you are not up moving around and exerting yourself. The breathing machine keeps your lungs expanded and your temperature down. . Do not place pillow  under the operative knee, focus on keeping the knee straight while resting  DIET You may resume your previous home diet once you are discharged from the hospital.  DRESSING / WOUND CARE / SHOWERING . Keep your bulky bandage on for 2 days. On the third post-operative day you may remove the Ace bandage and gauze. There is a waterproof adhesive bandage on your skin which will stay in place until your first follow-up appointment. Once you remove this you will not need to place another bandage . You may begin showering 3 days following surgery, but do not submerge the incision under water.  ACTIVITY For the first 5 days, the key is rest and control of pain and swelling . Do your home exercises twice a day starting on post-operative day 3. On the days you go to physical therapy, just do the home exercises once that day. . You should rest, ice and elevate the leg for 50 minutes out of every hour. Get up and walk/stretch for 10 minutes per hour. After 5 days you can increase your activity slowly as tolerated. . Walk with your walker as instructed. Use the walker until you are comfortable transitioning to a cane. Walk with the cane in the opposite hand of the operative leg. You may discontinue the cane once you are comfortable and walking steadily. . Avoid periods of inactivity such as sitting longer than an hour when not asleep. This helps prevent blood clots.  . You may discontinue the knee immobilizer once you are able to perform a straight leg raise while lying down. .   You may resume a sexual relationship in one month or when given the OK by your doctor.  . You may return to work once you are cleared by your doctor.  . Do not drive a car for 6 weeks or until released by your surgeon.  . Do not drive while taking narcotics.  TED HOSE STOCKINGS Wear the elastic stockings on both legs for three weeks following surgery during the day. You may remove them at night for sleeping.  WEIGHT BEARING Weight  bearing as tolerated with assist device (walker, cane, etc) as directed, use it as long as suggested by your surgeon or therapist, typically at least 4-6 weeks.  POSTOPERATIVE CONSTIPATION PROTOCOL Constipation - defined medically as fewer than three stools per week and severe constipation as less than one stool per week.  One of the most common issues patients have following surgery is constipation.  Even if you have a regular bowel pattern at home, your normal regimen is likely to be disrupted due to multiple reasons following surgery.  Combination of anesthesia, postoperative narcotics, change in appetite and fluid intake all can affect your bowels.  In order to avoid complications following surgery, here are some recommendations in order to help you during your recovery period.  . Colace (docusate) - Pick up an over-the-counter form of Colace or another stool softener and take twice a day as long as you are requiring postoperative pain medications.  Take with a full glass of water daily.  If you experience loose stools or diarrhea, hold the colace until you stool forms back up. If your symptoms do not get better within 1 week or if they get worse, check with your doctor. . Dulcolax (bisacodyl) - Pick up over-the-counter and take as directed by the product packaging as needed to assist with the movement of your bowels.  Take with a full glass of water.  Use this product as needed if not relieved by Colace only.  . MiraLax (polyethylene glycol) - Pick up over-the-counter to have on hand. MiraLax is a solution that will increase the amount of water in your bowels to assist with bowel movements.  Take as directed and can mix with a glass of water, juice, soda, coffee, or tea. Take if you go more than two days without a movement. Do not use MiraLax more than once per day. Call your doctor if you are still constipated or irregular after using this medication for 7 days in a row.  If you continue to have  problems with postoperative constipation, please contact the office for further assistance and recommendations.  If you experience "the worst abdominal pain ever" or develop nausea or vomiting, please contact the office immediatly for further recommendations for treatment.  ITCHING If you experience itching with your medications, try taking only a single pain pill, or even half a pain pill at a time.  You can also use Benadryl over the counter for itching or also to help with sleep.   MEDICATIONS See your medication summary on the "After Visit Summary" that the nursing staff will review with you prior to discharge.  You may have some home medications which will be placed on hold until you complete the course of blood thinner medication.  It is important for you to complete the blood thinner medication as prescribed by your surgeon.  Continue your approved medications as instructed at time of discharge.  PRECAUTIONS . If you experience chest pain or shortness of breath - call 911 immediately for   transfer to the hospital emergency department.  . If you develop a fever greater that 101 F, purulent drainage from wound, increased redness or drainage from wound, foul odor from the wound/dressing, or calf pain - CONTACT YOUR SURGEON.                                                   FOLLOW-UP APPOINTMENTS Make sure you keep all of your appointments after your operation with your surgeon and caregivers. You should call the office at the above phone number and make an appointment for approximately two weeks after the date of your surgery or on the date instructed by your surgeon outlined in the "After Visit Summary".  RANGE OF MOTION AND STRENGTHENING EXERCISES  Rehabilitation of the knee is important following a knee injury or an operation. After just a few days of immobilization, the muscles of the thigh which control the knee become weakened and shrink (atrophy). Knee exercises are designed to build up the  tone and strength of the thigh muscles and to improve knee motion. Often times heat used for twenty to thirty minutes before working out will loosen up your tissues and help with improving the range of motion but do not use heat for the first two weeks following surgery. These exercises can be done on a training (exercise) mat, on the floor, on a table or on a bed. Use what ever works the best and is most comfortable for you Knee exercises include:  . Leg Lifts - While your knee is still immobilized in a splint or cast, you can do straight leg raises. Lift the leg to 60 degrees, hold for 3 sec, and slowly lower the leg. Repeat 10-20 times 2-3 times daily. Perform this exercise against resistance later as your knee gets better.  . Quad and Hamstring Sets - Tighten up the muscle on the front of the thigh (Quad) and hold for 5-10 sec. Repeat this 10-20 times hourly. Hamstring sets are done by pushing the foot backward against an object and holding for 5-10 sec. Repeat as with quad sets.   Leg Slides: Lying on your back, slowly slide your foot toward your buttocks, bending your knee up off the floor (only go as far as is comfortable). Then slowly slide your foot back down until your leg is flat on the floor again.  Angel Wings: Lying on your back spread your legs to the side as far apart as you can without causing discomfort.  A rehabilitation program following serious knee injuries can speed recovery and prevent re-injury in the future due to weakened muscles. Contact your doctor or a physical therapist for more information on knee rehabilitation.   IF YOU ARE TRANSFERRED TO A SKILLED REHAB FACILITY If the patient is transferred to a skilled rehab facility following release from the hospital, a list of the current medications will be sent to the facility for the patient to continue.  When discharged from the skilled rehab facility, please have the facility set up the patient's Home Health Physical Therapy  prior to being released. Also, the skilled facility will be responsible for providing the patient with their medications at time of release from the facility to include their pain medication, the muscle relaxants, and their blood thinner medication. If the patient is still at the rehab facility at time of the   two week follow up appointment, the skilled rehab facility will also need to assist the patient in arranging follow up appointment in our office and any transportation needs.  MAKE SURE YOU:  . Understand these instructions.  . Get help right away if you are not doing well or get worse.   DENTAL ANTIBIOTICS:  In most cases prophylactic antibiotics for Dental procdeures after total joint surgery are not necessary.  Exceptions are as follows:  1. History of prior total joint infection  2. Severely immunocompromised (Organ Transplant, cancer chemotherapy, Rheumatoid biologic meds such as Humera)  3. Poorly controlled diabetes (A1C &gt; 8.0, blood glucose over 200)  If you have one of these conditions, contact your surgeon for an antibiotic prescription, prior to your dental procedure.    Pick up stool softner and laxative for home use following surgery while on pain medications. Do not submerge incision under water. Please use good hand washing techniques while changing dressing each day. May shower starting three days after surgery. Please use a clean towel to pat the incision dry following showers. Continue to use ice for pain and swelling after surgery. Do not use any lotions or creams on the incision until instructed by your surgeon.  

## 2020-08-07 NOTE — Transfer of Care (Signed)
Immediate Anesthesia Transfer of Care Note  Patient: Howard Morton  Procedure(s) Performed: TOTAL KNEE ARTHROPLASTY (Left Knee)  Patient Location: PACU  Anesthesia Type:Spinal and MAC combined with regional for post-op pain  Level of Consciousness: awake, alert , drowsy and patient cooperative  Airway & Oxygen Therapy: Patient Spontanous Breathing and Patient connected to face mask oxygen  Post-op Assessment: Report given to RN and Post -op Vital signs reviewed and stable  Post vital signs: Reviewed and stable  Last Vitals:  Vitals Value Taken Time  BP 123/67 08/07/20 1345  Temp    Pulse 69 08/07/20 1346  Resp 10 08/07/20 1346  SpO2 100 % 08/07/20 1346  Vitals shown include unvalidated device data.  Last Pain:  Vitals:   08/07/20 0943  TempSrc:   PainSc: 5       Patients Stated Pain Goal: 4 (34/94/94 4739)  Complications: No complications documented.

## 2020-08-07 NOTE — Evaluation (Signed)
Physical Therapy Evaluation Patient Details Name: Howard Morton MRN: [id number] DOB: [birth year] Today's Date: 08/07/2020   History of Present Illness  Patient is 64 y.o. male s/p Lt TKA on 08/07/20 with PMH significant for OA, BPPV, Asthma, GERD, anxiety.  Clinical Impression  Howard Morton is a 64 y.o. male POD 0 s/p Lt TKA. Patient reports independence with mobility at baseline. Patient is now limited by functional impairments (see PT problem list below) and requires min assist for transfers and gait with RW. Patient was able to ambulate ~70 feet with RW and min assist. Patient instructed in exercise to facilitate ROM and circulation. Patient will benefit from continued skilled PT interventions to address impairments and progress towards PLOF. Acute PT will follow to progress mobility and stair training in preparation for safe discharge home.     Follow Up Recommendations Follow surgeon's recommendation for DC plan and follow-up therapies;Outpatient PT    Equipment Recommendations  None recommended by PT    Recommendations for Other Services       Precautions / Restrictions Precautions Precautions: Fall Restrictions Weight Bearing Restrictions: No Other Position/Activity Restrictions: WBAT      Mobility  Bed Mobility Overal bed mobility: Needs Assistance Bed Mobility: Supine to Sit     Supine to sit: Min guard;HOB elevated     General bed mobility comments: pt using bed rail and HOB elevated, taking extra time, guarding for safety    Transfers Overall transfer level: Needs assistance Equipment used: Rolling walker (2 wheeled) Transfers: Sit to/from Stand Sit to Stand: Min assist         General transfer comment: cues for technique with RW, assist required to complete power up and steady in standing. assist to control slow lowering to sit in recliner due to hip weakness.   Ambulation/Gait Ambulation/Gait assistance: Min assist Gait Distance (Feet): 70  Feet Assistive device: Rolling walker (2 wheeled) Gait Pattern/deviations: Step-to pattern;Decreased stride length;Decreased weight shift to left Gait velocity: decr   General Gait Details: VC's for step pattern and proximity to RW, assist to steady due to hip weakness (suspect due to spinal block) and to manage walker position.  Stairs            Wheelchair Mobility    Modified Rankin (Stroke Patients Only)       Balance Overall balance assessment: Needs assistance Sitting-balance support: Feet supported Sitting balance-Leahy Scale: Good     Standing balance support: Bilateral upper extremity supported;During functional activity Standing balance-Leahy Scale: Poor Standing balance comment: reliant on external support                             Pertinent Vitals/Pain Pain Assessment: 0-10 Pain Score: 2  Pain Location: Lt knee Pain Descriptors / Indicators: Aching;Discomfort Pain Intervention(s): Limited activity within patient's tolerance;Monitored during session;Repositioned;Ice applied    Home Living Family/patient expects to be discharged to:: Private residence Living Arrangements: Spouse/significant other Available Help at Discharge: Family Type of Home: House Home Access: Stairs to enter Entrance Stairs-Rails: Left Entrance Stairs-Number of Steps: 4 Home Layout: Multi-level;Able to live on main level with bedroom/bathroom;Full bath on main level Home Equipment: Walker - 2 wheels;Bedside commode;Crutches;Cane - single point;Shower seat      Prior Function Level of Independence: Independent               Hand Dominance   Dominant Hand: Right    Extremity/Trunk Assessment   Upper Extremity Assessment Upper Extremity Assessment:  Overall WFL for tasks assessed    Lower Extremity Assessment Lower Extremity Assessment: LLE deficits/detail LLE Deficits / Details: good quad activation, no extensor lag with SLR LLE Sensation: WNL LLE  Coordination: WNL    Cervical / Trunk Assessment Cervical / Trunk Assessment: Normal  Communication   Communication: No difficulties  Cognition Arousal/Alertness: Awake/alert Behavior During Therapy: WFL for tasks assessed/performed Overall Cognitive Status: Within Functional Limits for tasks assessed                                        General Comments      Exercises Total Joint Exercises Ankle Circles/Pumps: AROM;Both;20 reps;Seated Quad Sets: AROM;10 reps;Seated;Left Heel Slides: AROM;10 reps;Seated;Left   Assessment/Plan    PT Assessment Patient needs continued PT services  PT Problem List Decreased strength;Decreased range of motion;Decreased activity tolerance;Decreased balance;Decreased mobility;Decreased knowledge of use of DME;Decreased knowledge of precautions       PT Treatment Interventions DME instruction;Gait training;Stair training;Functional mobility training;Therapeutic activities;Therapeutic exercise;Balance training;Patient/family education    PT Goals (Current goals can be found in the Care Plan section)  Acute Rehab PT Goals Patient Stated Goal: get back to independence and walking without pain PT Goal Formulation: With patient Time For Goal Achievement: 08/14/20 Potential to Achieve Goals: Good    Frequency 7X/week   Barriers to discharge        Co-evaluation               AM-PAC PT "6 Clicks" Mobility  Outcome Measure Help needed turning from your back to your side while in a flat bed without using bedrails?: None Help needed moving from lying on your back to sitting on the side of a flat bed without using bedrails?: A Little Help needed moving to and from a bed to a chair (including a wheelchair)?: A Little Help needed standing up from a chair using your arms (e.g., wheelchair or bedside chair)?: A Little Help needed to walk in hospital room?: A Little Help needed climbing 3-5 steps with a railing? : A Little 6  Click Score: 19    End of Session Equipment Utilized During Treatment: Gait belt Activity Tolerance: Patient tolerated treatment well Patient left: in chair;with call bell/phone within reach;with chair alarm set;with family/visitor present Nurse Communication: Mobility status PT Visit Diagnosis: Muscle weakness (generalized) (M62.81);Difficulty in walking, not elsewhere classified (R26.2)    Time: 7829-5621 PT Time Calculation (min) (ACUTE ONLY): 26 min   Charges:   PT Evaluation $PT Eval Low Complexity: 1 Low PT Treatments $Gait Training: 8-22 mins        Wynn Maudlin, DPT Acute Rehabilitation Services  Office [phone] Pager [phone]  08/07/2020 5:47 PM

## 2020-08-07 NOTE — Progress Notes (Signed)
Assisted Dr. Edmond Fitzgerald with left, ultrasound guided, adductor canal block. Side rails up, monitors on throughout procedure. See vital signs in flow sheet. Tolerated Procedure well. 

## 2020-08-07 NOTE — Anesthesia Procedure Notes (Signed)
Anesthesia Regional Block: Adductor canal block   Pre-Anesthetic Checklist: ,, timeout performed, Correct Patient, Correct Site, Correct Laterality, Correct Procedure, Correct Position, site marked, Risks and benefits discussed, pre-op evaluation,  At surgeon's request and post-op pain management  Laterality: Left  Prep: Maximum Sterile Barrier Precautions used, chloraprep       Needles:  Injection technique: Single-shot  Needle Type: Echogenic Stimulator Needle     Needle Length: 9cm  Needle Gauge: 21     Additional Needles:   Procedures:,,,, ultrasound used (permanent image in chart),,,,  Narrative:  Start time: 08/07/2020 11:26 AM End time: 08/07/2020 11:36 AM Injection made incrementally with aspirations every 5 mL.  Performed by: Personally  Anesthesiologist: Gaynelle Adu, MD  Additional Notes: 2% Lidocaine skin wheel.

## 2020-08-07 NOTE — Anesthesia Procedure Notes (Signed)
Procedure Name: MAC Date/Time: 08/07/2020 12:06 PM Performed by: West Pugh, CRNA Pre-anesthesia Checklist: Patient identified, Emergency Drugs available, Suction available, Patient being monitored and Timeout performed Patient Re-evaluated:Patient Re-evaluated prior to induction Oxygen Delivery Method: Simple face mask Preoxygenation: Pre-oxygenation with 100% oxygen Induction Type: IV induction Placement Confirmation: positive ETCO2 Dental Injury: Teeth and Oropharynx as per pre-operative assessment

## 2020-08-07 NOTE — Anesthesia Procedure Notes (Signed)
Spinal  Patient location during procedure: OR Start time: 08/07/2020 12:10 PM End time: 08/07/2020 12:15 PM Staffing Performed: resident/CRNA  Resident/CRNA: West Pugh, CRNA Preanesthetic Checklist Completed: patient identified, IV checked, site marked, risks and benefits discussed, surgical consent, monitors and equipment checked, pre-op evaluation and timeout performed Spinal Block Patient position: sitting Prep: DuraPrep and site prepped and draped Patient monitoring: heart rate, continuous pulse ox and blood pressure Approach: midline Location: L3-4 Injection technique: single-shot Needle Needle type: Pencan and Introducer  Needle gauge: 24 G Needle length: 10 cm Assessment Sensory level: T4 Additional Notes IV functioning, monitors applied to pt. Expiration date of kit checked and confirmed to be in date. Sterile prep and drape, hand hygiene and sterile gloved used. Pt was positioned and spine was prepped in sterile fashion. Skin was anesthetized with lidocaine. Free flow of clear CSF obtained prior to injecting local anesthetic into CSF x 1 attempt. Spinal needle aspirated freely following injection. Needle was carefully withdrawn, and pt tolerated procedure well. Loss of motor and sensory on exam post injection. Dr E.Fitzgerald present for procedure.

## 2020-08-07 NOTE — Op Note (Signed)
OPERATIVE REPORT-TOTAL KNEE ARTHROPLASTY   Pre-operative diagnosis- Osteoarthritis  Left knee(s)  Post-operative diagnosis- Osteoarthritis Left knee(s)  Procedure-  Left  Total Knee Arthroplasty  Surgeon- Howard Rankin. Breindel Collier, MD  Assistant- Arther Abbott, PA-C   Anesthesia-  Adductor canal block and spinal  EBL-25 mL   Drains None  Tourniquet time-  Total Tourniquet Time Documented: Thigh (Left) - 37 minutes Total: Thigh (Left) - 37 minutes     Complications- None  Condition-PACU - hemodynamically stable.   Brief Clinical Note   Howard Morton is a 64 y.o. year old male with end stage OA of his left knee with progressively worsening pain and dysfunction. He has constant pain, with activity and at rest and significant functional deficits with difficulties even with ADLs. He has had extensive non-op management including analgesics, injections of cortisone and viscosupplements, and home exercise program, but remains in significant pain with significant dysfunction. Radiographs show bone on bone arthritis medial and patellofemoral. He presents now for left Total Knee Arthroplasty.    Procedure in detail---   The patient is brought into the operating room and positioned supine on the operating table. After successful administration of  Adductor canal block and spinal,   a tourniquet is placed high on the  Left thigh(s) and the lower extremity is prepped and draped in the usual sterile fashion. Time out is performed by the operating team and then the  Left lower extremity is wrapped in Esmarch, knee flexed and the tourniquet inflated to 300 mmHg.       A midline incision is made with a ten blade through the subcutaneous tissue to the level of the extensor mechanism. A fresh blade is used to make a medial parapatellar arthrotomy. Soft tissue over the proximal medial tibia is subperiosteally elevated to the joint line with a knife and into the semimembranosus bursa with a Cobb elevator.  Soft tissue over the proximal lateral tibia is elevated with attention being paid to avoiding the patellar tendon on the tibial tubercle. The patella is everted, knee flexed 90 degrees and the ACL and PCL are removed. Findings are bone on bone medial and patellofemoral with large global osteophytes.        The drill is used to create a starting hole in the distal femur and the canal is thoroughly irrigated with sterile saline to remove the fatty contents. The 5 degree Left  valgus alignment guide is placed into the femoral canal and the distal femoral cutting block is pinned to remove 9 mm off the distal femur. Resection is made with an oscillating saw.      The tibia is subluxed forward and the menisci are removed. The extramedullary alignment guide is placed referencing proximally at the medial aspect of the tibial tubercle and distally along the second metatarsal axis and tibial crest. The block is pinned to remove 68mm off the more deficient medial  side. Resection is made with an oscillating saw. Size 8is the most appropriate size for the tibia and the proximal tibia is prepared with the modular drill and keel punch for that size.      The femoral sizing guide is placed and size 8 is most appropriate. Rotation is marked off the epicondylar axis and confirmed by creating a rectangular flexion gap at 90 degrees. The size 8 cutting block is pinned in this rotation and the anterior, posterior and chamfer cuts are made with the oscillating saw. The intercondylar block is then placed and that cut is made.  Trial size 8 tibial component, trial size 8 posterior stabilized femur and a 8  mm posterior stabilized rotating platform insert trial is placed. Full extension is achieved with excellent varus/valgus and anterior/posterior balance throughout full range of motion. The patella is everted and thickness measured to be 27  mm. Free hand resection is taken to 15 mm, a 41 template is placed, lug holes are drilled,  trial patella is placed, and it tracks normally. Osteophytes are removed off the posterior femur with the trial in place. All trials are removed and the cut bone surfaces prepared with pulsatile lavage. Cement is mixed and once ready for implantation, the size 8 tibial implant, size  8 posterior stabilized femoral component, and the size 41 patella are cemented in place and the patella is held with the clamp. The trial insert is placed and the knee held in full extension. The Exparel (20 ml mixed with 60 ml saline) is injected into the extensor mechanism, posterior capsule, medial and lateral gutters and subcutaneous tissues.  All extruded cement is removed and once the cement is hard the permanent 8 mm posterior stabilized rotating platform insert is placed into the tibial tray.      The wound is copiously irrigated with saline solution and the extensor mechanism closed with # 0 Stratofix suture. The tourniquet is released for a total tourniquet time of 37  minutes. Flexion against gravity is 140 degrees and the patella tracks normally. Subcutaneous tissue is closed with 2.0 vicryl and subcuticular with running 4.0 Monocryl. The incision is cleaned and dried and steri-strips and a bulky sterile dressing are applied. The limb is placed into a knee immobilizer and the patient is awakened and transported to recovery in stable condition.      Please note that a surgical assistant was a medical necessity for this procedure in order to perform it in a safe and expeditious manner. Surgical assistant was necessary to retract the ligaments and vital neurovascular structures to prevent injury to them and also necessary for proper positioning of the limb to allow for anatomic placement of the prosthesis.   Howard Rankin [name], MD    08/07/2020, 1:24 PM

## 2020-08-08 DIAGNOSIS — M1712 Unilateral primary osteoarthritis, left knee: Secondary | ICD-10-CM | POA: Diagnosis not present

## 2020-08-08 LAB — CBC
HCT: 36.9 % — ABNORMAL LOW (ref 39.0–52.0)
Hemoglobin: 11.9 g/dL — ABNORMAL LOW (ref 13.0–17.0)
MCH: 28.7 pg (ref 26.0–34.0)
MCHC: 32.2 g/dL (ref 30.0–36.0)
MCV: 88.9 fL (ref 80.0–100.0)
Platelets: 364 10*3/uL (ref 150–400)
RBC: 4.15 MIL/uL — ABNORMAL LOW (ref 4.22–5.81)
RDW: 13.7 % (ref 11.5–15.5)
WBC: 30.9 10*3/uL — ABNORMAL HIGH (ref 4.0–10.5)
nRBC: 0 % (ref 0.0–0.2)

## 2020-08-08 LAB — BASIC METABOLIC PANEL
Anion gap: 12 (ref 5–15)
BUN: 22 mg/dL (ref 8–23)
CO2: 23 mmol/L (ref 22–32)
Calcium: 8.6 mg/dL — ABNORMAL LOW (ref 8.9–10.3)
Chloride: 102 mmol/L (ref 98–111)
Creatinine, Ser: 0.96 mg/dL (ref 0.61–1.24)
GFR, Estimated: >60 mL/min (ref >60)
Glucose, Bld: 172 mg/dL — ABNORMAL HIGH (ref 70–99)
Potassium: 4.1 mmol/L (ref 3.5–5.1)
Sodium: 137 mmol/L (ref 135–145)

## 2020-08-08 MED ORDER — METHOCARBAMOL 500 MG PO TABS: 500.0000 mg | ORAL_TABLET | Freq: Four times a day (QID) | ORAL | 0 refills | Status: DC | PRN

## 2020-08-08 MED ORDER — LIDOCAINE HCL (PF) 1 % IJ SOLN
2.0000 mL | Freq: Once | INTRAMUSCULAR | Status: AC
  Administered 2020-08-08: 2 mL
  Filled 2020-08-08: qty 2

## 2020-08-08 MED ORDER — METHYLPREDNISOLONE ACETATE 80 MG/ML IJ SUSP
80.0000 mg | Freq: Once | INTRAMUSCULAR | Status: AC
  Administered 2020-08-08: 80 mg via INTRA_ARTICULAR
  Filled 2020-08-08: qty 1

## 2020-08-08 MED ORDER — OXYCODONE HCL 5 MG PO TABS
5.0000 mg | ORAL_TABLET | Freq: Four times a day (QID) | ORAL | 0 refills | Status: DC | PRN
Start: 2020-08-08 — End: 2020-09-27

## 2020-08-08 MED ORDER — ASPIRIN 325 MG PO TBEC: 325.0000 mg | DELAYED_RELEASE_TABLET | Freq: Two times a day (BID) | ORAL | 0 refills | Status: AC

## 2020-08-08 NOTE — Plan of Care (Signed)

## 2020-08-08 NOTE — TOC Progression Note (Signed)
Transition of Care [facility]) - Progression Note    Patient Details  Name: Howard Morton MRN: [id number] Date of Birth: August 04, 1956  Transition of Care [facility] CM/SW Contact  Armanda Heritage, RN Phone Number: 08/08/2020, 9:53 AM  Clinical Narrative:    CM spoke with patient who confirms plan for OPPT and states has rolling walker and 3in1 at home.   Expected Discharge Plan: Home/Self Care Barriers to Discharge: No Barriers Identified  Expected Discharge Plan and Services Expected Discharge Plan: Home/Self Care   Discharge Planning Services: CM Consult     Expected Discharge Date: 08/08/20               DME Arranged: N/A DME Agency: NA       HH Arranged: NA           Social Determinants of Health (SDOH) Interventions    Readmission Risk Interventions No flowsheet data found.

## 2020-08-08 NOTE — Progress Notes (Signed)
Physical Therapy Treatment Patient Details Name: Howard Morton MRN: [id number] DOB: 12-23-1955 Today's Date: 08/08/2020    History of Present Illness Patient is 64 y.o. male s/p Lt TKA on 08/07/20 with PMH significant for OA, BPPV, Asthma, GERD, anxiety.    PT Comments    Pt has met all PT goals for mobility and is ready for d/c home with his spouse. Pt has been educated on mobility with a RW, HEP, and also step training. Pt's wife is agreeable to provide assistance as needed until he reaches mod independent level. Pt has been given handouts to reinforce HEP and step technique. Pt is d/c from acute PT services.  Follow Up Recommendations  Follow surgeon's recommendation for DC plan and follow-up therapies;Outpatient PT     Equipment Recommendations  None recommended by PT    Recommendations for Other Services       Precautions / Restrictions Precautions Precautions: Fall Restrictions Other Position/Activity Restrictions: WBAT    Mobility  Bed Mobility Overal bed mobility: Modified Independent Bed Mobility: Supine to Sit     Supine to sit: Modified independent (Device/Increase time);HOB elevated        Transfers Overall transfer level: Needs assistance Equipment used: Rolling walker (2 wheeled) Transfers: Sit to/from Stand Sit to Stand: Supervision         General transfer comment: cues for technique for improved safety  Ambulation/Gait Ambulation/Gait assistance: Supervision Gait Distance (Feet): 185 Feet Assistive device: Rolling walker (2 wheeled) Gait Pattern/deviations: Step-through pattern;Decreased stride length Gait velocity: decr   General Gait Details: VC's for step pattern and proximity to RW   Stairs Stairs: Yes Stairs assistance: Min guard Stair Management: One rail Left;Two rails;Sideways;Forwards Number of Stairs: 5 General stair comments: fatigued after steps and c/o some dizziness, wife was present during training and given a stair  handout   Wheelchair Mobility    Modified Rankin (Stroke Patients Only)       Balance Overall balance assessment: Mild deficits observed, not formally tested   Sitting balance-Leahy Scale: Normal     Standing balance support: Bilateral upper extremity supported Standing balance-Leahy Scale: Fair                              Cognition Arousal/Alertness: Awake/alert Behavior During Therapy: WFL for tasks assessed/performed Overall Cognitive Status: Within Functional Limits for tasks assessed                                        Exercises Total Joint Exercises Ankle Circles/Pumps: AROM;Both;20 reps;Supine Quad Sets: AROM;10 reps;Left;Supine Heel Slides: AROM;10 reps;Left;Supine Hip ABduction/ADduction: AROM;Strengthening;Left;10 reps;Supine Straight Leg Raises: AROM;Strengthening;Left;10 reps;Supine Long Arc Quad: AROM;Strengthening;Left;10 reps;Seated Knee Flexion: AROM;Strengthening;Left;10 reps;Seated Goniometric ROM: 80*    General Comments General comments (skin integrity, edema, etc.): wife assisted pt with mobility and also step training. Pt/spouse demonstrated safe technique      Pertinent Vitals/Pain Pain Assessment: 0-10 Pain Score: 2  Pain Location: Lt knee Pain Descriptors / Indicators: Aching;Discomfort Pain Intervention(s): Limited activity within patient's tolerance;Repositioned;Monitored during session    Home Living                      Prior Function            PT Goals (current goals can now be found in the care plan section) Progress towards PT goals: Goals  met/education completed, patient discharged from PT    Frequency    7X/week      PT Plan Current plan remains appropriate    Co-evaluation              AM-PAC PT "6 Clicks" Mobility   Outcome Measure  Help needed turning from your back to your side while in a flat bed without using bedrails?: None Help needed moving from lying on  your back to sitting on the side of a flat bed without using bedrails?: None Help needed moving to and from a bed to a chair (including a wheelchair)?: A Little Help needed standing up from a chair using your arms (e.g., wheelchair or bedside chair)?: A Little Help needed to walk in hospital room?: A Little Help needed climbing 3-5 steps with a railing? : A Little 6 Click Score: 20    End of Session Equipment Utilized During Treatment: Gait belt Activity Tolerance: Patient tolerated treatment well Patient left: with call bell/phone within reach;with family/visitor present;in chair Nurse Communication: Mobility status PT Visit Diagnosis: Muscle weakness (generalized) (M62.81);Difficulty in walking, not elsewhere classified (R26.2)     Time: 3354-5625 PT Time Calculation (min) (ACUTE ONLY): 30 min  Charges:  $Gait Training: 8-22 mins $Therapeutic Exercise: 8-22 mins $Therapeutic Activity: 8-22 mins                     Lelon Mast 08/08/2020, 11:39 AM

## 2020-08-08 NOTE — Procedures (Signed)
After verbal consent was obtained, the right knee was identified and prepared with alcohol prep. The knee was then injected from a lateral approach with a mixture of 2 cc Depo-medrol (40 mg/mL) for a total of 80 mg and 2 cc's of 1% lidocaine. The patient tolerated the procedure well, without complications. The injection site was dressed with an adhesive bandage. The patient was advised to alert the nurse with any adverse reactions. The patient was advised to ice the knee intermittently over the next 24 hours.

## 2020-08-08 NOTE — Progress Notes (Signed)
Subjective: 1 Day Post-Op Procedure(s) (LRB): TOTAL KNEE ARTHROPLASTY (Left) Patient reports pain as mild.   Patient seen in rounds with Dr. Lequita Halt. Patient is well, and has had no acute complaints or problems other than pain in both knees. Right knee intraarticular cortisone injection performed today, see separate procedure note. Denies chest pain, SOB, or calf pain. No issues overnight. Foley catheter removed this AM. We will continue therapy today, ambulated 46' yesterday.  Objective: Vital signs in last 24 hours: Temp:  [97.2 F (36.2 C)-98.1 F (36.7 C)] 97.9 F (36.6 C) (12/07 0411) Pulse Rate:  [64-87] 80 (12/07 0411) Resp:  [7-22] 20 (12/07 0411) BP: (108-166)/(66-92) 145/75 (12/07 0411) SpO2:  [98 %-100 %] 99 % (12/07 0411) Weight:  [89.8 kg] 89.8 kg (12/06 0943)  Intake/Output from previous day:  Intake/Output Summary (Last 24 hours) at 08/08/2020 0803 Last data filed at 08/08/2020 0600 Gross per 24 hour  Intake 3890.6 ml  Output 2675 ml  Net 1215.6 ml     Intake/Output this shift: No intake/output data recorded.  Labs: Recent Labs    08/08/20 0351  HGB 11.9*   Recent Labs    08/08/20 0351  WBC 30.9*  RBC 4.15*  HCT 36.9*  PLT 364   Recent Labs    08/08/20 0351  NA 137  K 4.1  CL 102  CO2 23  BUN 22  CREATININE 0.96  GLUCOSE 172*  CALCIUM 8.6*   No results for input(s): LABPT, INR in the last 72 hours.  Exam: General - Patient is Alert and Oriented Extremity - Neurologically intact Neurovascular intact Sensation intact distally Dorsiflexion/Plantar flexion intact Dressing - dressing C/D/I Motor Function - intact, moving foot and toes well on exam.   Past Medical History:  Diagnosis Date  . Anxiety   . Asthma   . Benign paroxysmal positional vertigo   . BPH (benign prostatic hyperplasia)   . CHI (closed head injury)   . Eczema   . ED (erectile dysfunction)   . GERD (gastroesophageal reflux disease)   . Hypercholesterolemia     . Insomnia   . Osteoarthritis of both knees   . Pneumonia   . Short-term memory loss   . Thrombocytosis     Assessment/Plan: 1 Day Post-Op Procedure(s) (LRB): TOTAL KNEE ARTHROPLASTY (Left) Principal Problem:   OA (osteoarthritis) of knee Active Problems:   Primary osteoarthritis of left knee  Estimated body mass index is 27.62 kg/m as calculated from the following:   Height as of this encounter: 5\' 11"  (1.803 m).   Weight as of this encounter: 89.8 kg. Advance diet Up with therapy D/C IV fluids   Patient's anticipated LOS is less than 2 midnights, meeting these requirements: - Younger than 10 - Lives within 1 hour of care - Has a competent adult at home to recover with post-op recover - NO history of  - Chronic pain requiring opiods  - Diabetes  - Coronary Artery Disease  - Heart failure  - Heart attack  - Stroke  - DVT/VTE  - Cardiac arrhythmia  - Respiratory Failure/COPD  - Renal failure  - Anemia  - Advanced Liver disease  DVT Prophylaxis - Aspirin Weight bearing as tolerated. Continue therapy.  Plan is to go Home after hospital stay. Plan for discharge today once cleared by physical therapy and meeting his goals. Scheduled for OPPT at [facility]). Follow-up in the office December 21st.  The PDMP database was reviewed today prior to any opioid medications being prescribed  to this patient.   Arther Abbott, PA-C Orthopedic Surgery [phone] 08/08/2020, 8:03 AM

## 2020-08-08 NOTE — Progress Notes (Signed)
Physical Therapy Treatment Patient Details Name: Howard Morton MRN: [id number] DOB: [birth year] Today's Date: 08/08/2020    History of Present Illness Patient is 64 y.o. male s/p Lt TKA on 08/07/20 with PMH significant for OA, BPPV, Asthma, GERD, anxiety.    PT Comments    Pt is making great progress with mobility. Pt's wife was present for therapy and educated on mobility. Pt is ambulating 180 ft with RW min guard assist. Supervision with HEP and active knee flexion 80*. Pt did begin step training. But c/o mild dizziness therefore we sat down and will complete step training during BID treatment later this morning. Pt will continue to benefit from acute skilled PT to maximize mobility and Independence for d/c home today after BID therapy.   Follow Up Recommendations  Follow surgeon's recommendation for DC plan and follow-up therapies;Outpatient PT     Equipment Recommendations  None recommended by PT    Recommendations for Other Services       Precautions / Restrictions Precautions Precautions: Fall Restrictions Other Position/Activity Restrictions: WBAT    Mobility  Bed Mobility Overal bed mobility: Modified Independent Bed Mobility: Supine to Sit     Supine to sit: Modified independent (Device/Increase time);HOB elevated        Transfers Overall transfer level: Needs assistance Equipment used: Rolling walker (2 wheeled) Transfers: Sit to/from Stand Sit to Stand: Min guard         General transfer comment: cues for technique for improved safety  Ambulation/Gait Ambulation/Gait assistance: Supervision Gait Distance (Feet): 185 Feet Assistive device: Rolling walker (2 wheeled) Gait Pattern/deviations: Step-through pattern;Decreased stride length Gait velocity: decr       Stairs Stairs: Yes Stairs assistance: Min guard Stair Management: One rail Left;Sideways Number of Stairs: 4 General stair comments: fatigued after steps and c/o some dizziness, wife was  present during training and given a stair handout   Wheelchair Mobility    Modified Rankin (Stroke Patients Only)       Balance Overall balance assessment: Mild deficits observed, not formally tested   Sitting balance-Leahy Scale: Normal     Standing balance support: Bilateral upper extremity supported Standing balance-Leahy Scale: Fair                              Cognition Arousal/Alertness: Awake/alert Behavior During Therapy: WFL for tasks assessed/performed Overall Cognitive Status: Within Functional Limits for tasks assessed                                        Exercises Total Joint Exercises Ankle Circles/Pumps: AROM;Both;20 reps;Supine Quad Sets: AROM;10 reps;Left;Supine Heel Slides: AROM;10 reps;Left;Supine Hip ABduction/ADduction: AROM;Strengthening;Left;10 reps;Supine Straight Leg Raises: AROM;Strengthening;Left;10 reps;Supine Long Arc Quad: AROM;Strengthening;Left;10 reps;Seated Knee Flexion: AROM;Strengthening;Left;10 reps;Seated Goniometric ROM: 80*    General Comments General comments (skin integrity, edema, etc.): wife was present during session and educated on HEP and basic mobility with RW and step training.      Pertinent Vitals/Pain Pain Assessment: 0-10 Pain Score: 4  Pain Location: Lt knee Pain Descriptors / Indicators: Aching;Discomfort Pain Intervention(s): Limited activity within patient's tolerance;Repositioned;Monitored during session    Home Living                      Prior Function            PT Goals (current goals can now  be found in the care plan section) Progress towards PT goals: Progressing toward goals    Frequency    7X/week      PT Plan Current plan remains appropriate    Co-evaluation              AM-PAC PT "6 Clicks" Mobility   Outcome Measure  Help needed turning from your back to your side while in a flat bed without using bedrails?: None Help needed moving  from lying on your back to sitting on the side of a flat bed without using bedrails?: None Help needed moving to and from a bed to a chair (including a wheelchair)?: A Little Help needed standing up from a chair using your arms (e.g., wheelchair or bedside chair)?: A Little Help needed to walk in hospital room?: A Little Help needed climbing 3-5 steps with a railing? : A Little 6 Click Score: 20    End of Session Equipment Utilized During Treatment: Gait belt Activity Tolerance: Patient tolerated treatment well Patient left: in bed;with call bell/phone within reach;with family/visitor present Nurse Communication: Mobility status PT Visit Diagnosis: Muscle weakness (generalized) (M62.81);Difficulty in walking, not elsewhere classified (R26.2)     Time: 2831-5176 PT Time Calculation (min) (ACUTE ONLY): 44 min  Charges:  $Gait Training: 8-22 mins $Therapeutic Exercise: 8-22 mins $Therapeutic Activity: 8-22 mins                      Greggory Stallion 08/08/2020, 9:06 AM

## 2020-08-08 NOTE — Progress Notes (Signed)
Physical Therapy Discharge Patient Details Name: Howard Morton MRN: [id number] DOB: [birth year] Today's Date: 08/08/2020 Time: 3754-3606 PT Time Calculation (min) (ACUTE ONLY): 30 min  Patient discharged from PT services secondary to goals met and no further PT needs identified.  Please see latest therapy progress note for current level of functioning and progress toward goals.    Progress and discharge plan discussed with patient and/or caregiver: Patient/Caregiver agrees with plan  GP     Lelon Mast 08/08/2020, 11:45 AM

## 2020-08-08 NOTE — Progress Notes (Signed)
Notified [facility] [place] that patient is requesting a tramadol refill for home due to oxycodone causing dizziness. Patient is stable and ready for discharge.

## 2020-08-11 ENCOUNTER — Encounter (HOSPITAL_COMMUNITY): Payer: Self-pay | Admitting: Orthopedic Surgery

## 2020-08-14 NOTE — Discharge Summary (Signed)
Physician Discharge Summary   Patient ID: Howard Morton MRN: [id number] DOB/AGE: Dec 04, 1955 64 y.o.  Admit date: 08/07/2020 Discharge date: 08/08/2020  Primary Diagnosis: Osteoarthritis, left knee   Admission Diagnoses:  Past Medical History:  Diagnosis Date   Anxiety    Asthma    Benign paroxysmal positional vertigo    BPH (benign prostatic hyperplasia)    CHI (closed head injury)    Eczema    ED (erectile dysfunction)    GERD (gastroesophageal reflux disease)    Hypercholesterolemia    Insomnia    Osteoarthritis of both knees    Pneumonia    Short-term memory loss    Thrombocytosis    Discharge Diagnoses:   Principal Problem:   OA (osteoarthritis) of knee Active Problems:   Primary osteoarthritis of left knee  Estimated body mass index is 27.62 kg/m as calculated from the following:   Height as of this encounter: 5\' 11"  (1.803 m).   Weight as of this encounter: 89.8 kg.  Procedure:  Procedure(s) (LRB): TOTAL KNEE ARTHROPLASTY (Left)   Consults: None  HPI: Howard Morton is a 64 y.o. year old male with end stage OA of his left knee with progressively worsening pain and dysfunction. He has constant pain, with activity and at rest and significant functional deficits with difficulties even with ADLs. He has had extensive non-op management including analgesics, injections of cortisone and viscosupplements, and home exercise program, but remains in significant pain with significant dysfunction. Radiographs show bone on bone arthritis medial and patellofemoral. He presents now for left Total Knee Arthroplasty.    Laboratory Data: Admission on 08/07/2020, Discharged on 08/08/2020  Component Date Value Ref Range Status   ABO/RH(D) 08/07/2020    Final                   Value:B NEG Performed at [facility], [address], [place], [place]    WBC 08/08/2020 30.9* 4.0 - 10.5 K/uL Final   RBC 08/08/2020 4.15* 4.22 - 5.81 MIL/uL  Final   Hemoglobin 08/08/2020 11.9* 13.0 - 17.0 g/dL Final   HCT 14/03/2020 36.9* 39.0 - 52.0 % Final   MCV 08/08/2020 88.9  80.0 - 100.0 fL Final   MCH 08/08/2020 28.7  26.0 - 34.0 pg Final   MCHC 08/08/2020 32.2  30.0 - 36.0 g/dL Final   RDW 14/03/2020 13.7  11.5 - 15.5 % Final   Platelets 08/08/2020 364  150 - 400 K/uL Final   nRBC 08/08/2020 0.0  0.0 - 0.2 % Final   Performed at [facility], [address] [place], [place]   Sodium 08/08/2020 137  135 - 145 mmol/L Final   Potassium 08/08/2020 4.1  3.5 - 5.1 mmol/L Final   Chloride 08/08/2020 102  98 - 111 mmol/L Final   CO2 08/08/2020 23  22 - 32 mmol/L Final   Glucose, Bld 08/08/2020 172* 70 - 99 mg/dL Final   Glucose reference range applies only to samples taken after fasting for at least 8 hours.   BUN 08/08/2020 22  8 - 23 mg/dL Final   Creatinine, Ser 08/08/2020 0.96  0.61 - 1.24 mg/dL Final   Calcium 14/03/2020 8.6* 8.9 - 10.3 mg/dL Final   GFR, Estimated 08/08/2020 >60  >60 mL/min Final   Comment: (NOTE) Calculated using the CKD-EPI Creatinine Equation (2021)    Anion gap 08/08/2020 12  5 - 15 Final   Performed at [facility], [address]  [place], [place] [address]  Hospital Outpatient Visit on 08/03/2020  Component Date Value Ref Range Status   SARS Coronavirus 2 08/03/2020 NEGATIVE  NEGATIVE Final   Comment: (NOTE) SARS-CoV-2 target nucleic acids are NOT DETECTED.  The SARS-CoV-2 RNA is generally detectable in upper and lower respiratory specimens during the acute phase of infection. Negative results do not preclude SARS-CoV-2 infection, do not rule out co-infections with other pathogens, and should not be used as the sole basis for treatment or other patient management decisions. Negative results must be combined with clinical observations, patient history, and epidemiological information. The expected result is Negative.  Fact Sheet for  Patients: HairSlick.no  Fact Sheet for Healthcare Providers: quierodirigir.com  This test is not yet approved or cleared by the [place] FDA and  has been authorized for detection and/or diagnosis of SARS-CoV-2 by FDA under an Emergency Use Authorization (EUA). This EUA will remain  in effect (meaning this test can be used) for the duration of the COVID-19 declaration under Se                          ction 564(b)(1) of the Act, 21 U.S.C. section 360bbb-3(b)(1), unless the authorization is terminated or revoked sooner.  Performed at [facility], [address], [place], [place] [address]   Hospital Outpatient Visit on 07/31/2020  Component Date Value Ref Range Status   MRSA, PCR 07/31/2020 NEGATIVE  NEGATIVE Final   Staphylococcus aureus 07/31/2020 NEGATIVE  NEGATIVE Final   Comment: (NOTE) The Xpert SA Assay (FDA approved for NASAL specimens in patients 71 years of age and older), is one component of a comprehensive surveillance program. It is not intended to diagnose infection nor to guide or monitor treatment. Performed at [facility], [address], [place], [place] [address]    aPTT 07/31/2020 28  24 - 36 seconds Final   Performed at [facility], [address] [place], [place] [address]   WBC 07/31/2020 14.7* 4.0 - 10.5 K/uL Final   RBC 07/31/2020 5.29  4.22 - 5.81 MIL/uL Final   Hemoglobin 07/31/2020 15.2  13.0 - 17.0 g/dL Final   HCT 62/37/6283 46.4  39.0 - 52.0 % Final   MCV 07/31/2020 87.7  80.0 - 100.0 fL Final   MCH 07/31/2020 28.7  26.0 - 34.0 pg Final   MCHC 07/31/2020 32.8  30.0 - 36.0 g/dL Final   RDW 15/17/6160 13.2  11.5 - 15.5 % Final   Platelets 07/31/2020 391  150 - 400 K/uL Final   nRBC 07/31/2020 0.0  0.0 - 0.2 % Final   Performed at [facility], [address] [place], [place] [address]   Sodium 07/31/2020 138   135 - 145 mmol/L Final   Potassium 07/31/2020 4.2  3.5 - 5.1 mmol/L Final   Chloride 07/31/2020 101  98 - 111 mmol/L Final   CO2 07/31/2020 26  22 - 32 mmol/L Final   Glucose, Bld 07/31/2020 104* 70 - 99 mg/dL Final   Glucose reference range applies only to samples taken after fasting for at least 8 hours.   BUN 07/31/2020 22  8 - 23 mg/dL Final   Creatinine, Ser 07/31/2020 0.99  0.61 - 1.24 mg/dL Final   Calcium 62/69/4854 9.9  8.9 - 10.3 mg/dL Final   Total Protein 62/70/3500 8.1  6.5 - 8.1 g/dL Final   Albumin 93/81/8299 4.6  3.5 - 5.0 g/dL Final  AST 07/31/2020 22  15 - 41 U/L Final   ALT 07/31/2020 30  0 - 44 U/L Final   Alkaline Phosphatase 07/31/2020 100  38 - 126 U/L Final   Total Bilirubin 07/31/2020 0.4  0.3 - 1.2 mg/dL Final   GFR, Estimated 07/31/2020 >60  >60 mL/min Final   Comment: (NOTE) Calculated using the CKD-EPI Creatinine Equation (2021)    Anion gap 07/31/2020 11  5 - 15 Final   Performed at [facility] [address] [place], [place] [address]   Prothrombin Time 07/31/2020 13.1  11.4 - 15.2 seconds Final   INR 07/31/2020 1.0  0.8 - 1.2 Final   Comment: (NOTE) INR goal varies based on device and disease states. Performed at [facility], [address] [place], [place] [address]    ABO/RH(D) 07/31/2020 B NEG   Final   Antibody Screen 07/31/2020 NEG   Final   Sample Expiration 07/31/2020 08/10/2020,2359   Final   Extend sample reason 07/31/2020    Final                   Value:NO TRANSFUSIONS OR PREGNANCY IN THE PAST 3 MONTHS Performed at [facility], [address], [place], [place] [address]      X-Rays:No results found.  EKG:No orders found for this or any previous visit.   Hospital Course: Howard Morton is a 64 y.o. who was admitted to [facility]. They were brought to the operating room on 08/07/2020 and underwent Procedure(s): TOTAL KNEE ARTHROPLASTY.   Patient tolerated the procedure well and was later transferred to the recovery room and then to the orthopaedic floor for postoperative care. They were given PO and IV analgesics for pain control following their surgery. They were given 24 hours of postoperative antibiotics of  Anti-infectives (From admission, onward)   Start     Dose/Rate Route Frequency Ordered Stop   08/07/20 1800  ceFAZolin (ANCEF) IVPB 2g/100 mL premix        2 g 200 mL/hr over 30 Minutes Intravenous Every 6 hours 08/07/20 1453 08/08/20 0008   08/07/20 0930  ceFAZolin (ANCEF) IVPB 2g/100 mL premix        2 g 200 mL/hr over 30 Minutes Intravenous On call to O.R. 08/07/20 9147 08/07/20 1246     and started on DVT prophylaxis in the form of Aspirin.   PT and OT were ordered for total joint protocol. Discharge planning consulted to help with postop disposition and equipment needs.  Patient had a good night on the evening of surgery. They started to get up OOB with therapy on POD #0. Pt was seen during rounds and was ready to go home pending progress with therapy. Right knee injected with intraarticular cortisone injection (procedure note in chart). He worked with therapy on POD #1 and was meeting his goals. Pt was discharged to home later that day in stable condition.  Diet: Regular diet Activity: WBAT Follow-up: in 2 weeks Disposition: Home with outpatient physical therapy  Discharged Condition: stable   Discharge Instructions    Call MD / Call 911   Complete by: As directed    If you experience chest pain or shortness of breath, CALL 911 and be transported to the hospital emergency room.  If you develope a fever above 101 F, pus (white drainage) or increased drainage or redness at the wound, or calf pain, call your surgeon's office.   Change dressing   Complete by:  As directed    You may remove the bulky bandage (ACE wrap and gauze) two days after surgery. You will have an adhesive waterproof bandage underneath. Leave  this in place until your first follow-up appointment.   Constipation Prevention   Complete by: As directed    Drink plenty of fluids.  Prune juice may be helpful.  You may use a stool softener, such as Colace (over the counter) 100 mg twice a day.  Use MiraLax (over the counter) for constipation as needed.   Diet - low sodium heart healthy   Complete by: As directed    Do not put a pillow under the knee. Place it under the heel.   Complete by: As directed    Driving restrictions   Complete by: As directed    No driving for two weeks   TED hose   Complete by: As directed    Use stockings (TED hose) for three weeks on both leg(s).  You may remove them at night for sleeping.   Weight bearing as tolerated   Complete by: As directed      Allergies as of 08/08/2020      Reactions   Valproic Acid Rash   Rash      Medication List    STOP taking these medications   naproxen sodium 220 MG tablet Commonly known as: ALEVE     TAKE these medications   albuterol 108 (90 Base) MCG/ACT inhaler Commonly known as: VENTOLIN HFA Inhale 2 puffs into the lungs every 6 (six) hours as needed for wheezing or shortness of breath.   aspirin 325 MG EC tablet Take 1 tablet (325 mg total) by mouth 2 (two) times daily for 20 days. Then resume one 81 mg aspirin once a day. What changed:   medication strength  how much to take  when to take this  additional instructions   diclofenac Sodium 1 % Gel Commonly known as: VOLTAREN Apply 2 g topically 4 (four) times daily.   gabapentin 300 MG capsule Commonly known as: NEURONTIN Take 600 mg by mouth 2 (two) times daily.   hydrocortisone 2.5 % cream Apply 1 application topically 2 (two) times daily as needed for itching.   loratadine 10 MG tablet Commonly known as: CLARITIN Take 10 mg by mouth daily.   LORazepam 1 MG tablet Commonly known as: ATIVAN Take 1 mg by mouth daily as needed for anxiety.   methocarbamol 500 MG tablet Commonly known  as: ROBAXIN Take 1 tablet (500 mg total) by mouth every 6 (six) hours as needed for muscle spasms.   multivitamin with minerals Tabs tablet Take 1 tablet by mouth daily.   oxyCODONE 5 MG immediate release tablet Commonly known as: Oxy IR/ROXICODONE Take 1-2 tablets (5-10 mg total) by mouth every 6 (six) hours as needed for severe pain.   traMADol 50 MG tablet Commonly known as: ULTRAM Take 100 mg by mouth 2 (two) times daily as needed for moderate pain.   VITAMIN D3 PO Take 1 capsule by mouth daily.   Wixela Inhub 100-50 MCG/DOSE Aepb Generic drug: Fluticasone-Salmeterol Inhale 1 puff into the lungs 2 (two) times daily.   ZINC PO Take 1 tablet by mouth daily.            Discharge Care Instructions  (From admission, onward)         Start     Ordered   08/08/20 0000  Weight bearing as tolerated        08/08/20  0569   08/08/20 0000  Change dressing       Comments: You may remove the bulky bandage (ACE wrap and gauze) two days after surgery. You will have an adhesive waterproof bandage underneath. Leave this in place until your first follow-up appointment.   08/08/20 7948          Follow-up Information    Ollen Gross, MD. Schedule an appointment as soon as possible for a visit on 08/22/2020.   Specialty: Orthopedic Surgery Contact information: [address] [place] [address] [place] [address] [phone]               Signed: Arther Abbott, PA-C Orthopedic Surgery 08/14/2020, 1:40 PM

## 2020-08-16 ENCOUNTER — Other Ambulatory Visit: Payer: Self-pay

## 2020-08-16 ENCOUNTER — Emergency Department (HOSPITAL_COMMUNITY): Payer: Medicare HMO

## 2020-08-16 ENCOUNTER — Emergency Department (HOSPITAL_COMMUNITY)
Admission: EM | Admit: 2020-08-16 | Discharge: 2020-08-16 | Disposition: A | Discharge status: Home / Self Care | Payer: Medicare HMO | Attending: Emergency Medicine | Admitting: Emergency Medicine

## 2020-08-16 ENCOUNTER — Encounter (HOSPITAL_COMMUNITY): Payer: Self-pay | Admitting: Emergency Medicine

## 2020-08-16 DIAGNOSIS — Z96652 Presence of left artificial knee joint: Secondary | ICD-10-CM | POA: Insufficient documentation

## 2020-08-16 DIAGNOSIS — Z20822 Contact with and (suspected) exposure to covid-19: Secondary | ICD-10-CM | POA: Diagnosis not present

## 2020-08-16 DIAGNOSIS — J45909 Unspecified asthma, uncomplicated: Secondary | ICD-10-CM | POA: Insufficient documentation

## 2020-08-16 DIAGNOSIS — R42 Dizziness and giddiness: Secondary | ICD-10-CM

## 2020-08-16 DIAGNOSIS — R112 Nausea with vomiting, unspecified: Secondary | ICD-10-CM | POA: Diagnosis not present

## 2020-08-16 DIAGNOSIS — Z7982 Long term (current) use of aspirin: Secondary | ICD-10-CM | POA: Insufficient documentation

## 2020-08-16 LAB — BASIC METABOLIC PANEL
Anion gap: 13 (ref 5–15)
BUN: 20 mg/dL (ref 8–23)
CO2: 21 mmol/L — ABNORMAL LOW (ref 22–32)
Calcium: 8.9 mg/dL (ref 8.9–10.3)
Chloride: 102 mmol/L (ref 98–111)
Creatinine, Ser: 0.7 mg/dL (ref 0.61–1.24)
GFR, Estimated: >60 mL/min (ref >60)
Glucose, Bld: 99 mg/dL (ref 70–99)
Potassium: 3.8 mmol/L (ref 3.5–5.1)
Sodium: 136 mmol/L (ref 135–145)

## 2020-08-16 LAB — URINALYSIS, ROUTINE W REFLEX MICROSCOPIC
Bilirubin Urine: NEGATIVE
Glucose, UA: NEGATIVE mg/dL
Hgb urine dipstick: NEGATIVE
Ketones, ur: NEGATIVE mg/dL
Leukocytes,Ua: NEGATIVE
Nitrite: NEGATIVE
Protein, ur: NEGATIVE mg/dL
Specific Gravity, Urine: 1.011 (ref 1.005–1.030)
pH: 7 (ref 5.0–8.0)

## 2020-08-16 LAB — CBC WITH DIFFERENTIAL/PLATELET
Abs Immature Granulocytes: 0.35 10*3/uL — ABNORMAL HIGH (ref 0.00–0.07)
Basophils Absolute: 0.1 10*3/uL (ref 0.0–0.1)
Basophils Relative: 0 %
Eosinophils Absolute: 0.3 10*3/uL (ref 0.0–0.5)
Eosinophils Relative: 1 %
HCT: 37.3 % — ABNORMAL LOW (ref 39.0–52.0)
Hemoglobin: 12 g/dL — ABNORMAL LOW (ref 13.0–17.0)
Immature Granulocytes: 2 %
Lymphocytes Relative: 11 %
Lymphs Abs: 2.4 10*3/uL (ref 0.7–4.0)
MCH: 28.3 pg (ref 26.0–34.0)
MCHC: 32.2 g/dL (ref 30.0–36.0)
MCV: 88 fL (ref 80.0–100.0)
Monocytes Absolute: 2.7 10*3/uL — ABNORMAL HIGH (ref 0.1–1.0)
Monocytes Relative: 13 %
Neutro Abs: 15.4 10*3/uL — ABNORMAL HIGH (ref 1.7–7.7)
Neutrophils Relative %: 73 %
Platelets: 590 10*3/uL — ABNORMAL HIGH (ref 150–400)
RBC: 4.24 MIL/uL (ref 4.22–5.81)
RDW: 13.7 % (ref 11.5–15.5)
WBC: 21.1 10*3/uL — ABNORMAL HIGH (ref 4.0–10.5)
nRBC: 0 % (ref 0.0–0.2)

## 2020-08-16 LAB — RESP PANEL BY RT-PCR (FLU A&B, COVID) ARPGX2
Influenza A by PCR: NEGATIVE
Influenza B by PCR: NEGATIVE
SARS Coronavirus 2 by RT PCR: NEGATIVE

## 2020-08-16 MED ORDER — ONDANSETRON 4 MG PO TBDP: 4.0000 mg | ORAL_TABLET | Freq: Three times a day (TID) | ORAL | 0 refills | Status: DC | PRN

## 2020-08-16 MED ORDER — SODIUM CHLORIDE 0.9 % IV BOLUS
1000.0000 mL | Freq: Once | INTRAVENOUS | Status: AC
  Administered 2020-08-16: 16:00:00 1000 mL via INTRAVENOUS

## 2020-08-16 MED ORDER — FLEET ENEMA 7-19 GM/118ML RE ENEM: 1.0000 | ENEMA | Freq: Once | RECTAL | 0 refills | Status: AC

## 2020-08-16 MED ORDER — MECLIZINE HCL 25 MG PO TABS
25.0000 mg | ORAL_TABLET | Freq: Once | ORAL | Status: AC
  Administered 2020-08-16: 20:00:00 25 mg via ORAL
  Filled 2020-08-16: qty 1

## 2020-08-16 MED ORDER — GADOBUTROL 1 MMOL/ML IV SOLN: 9.0000 mL | Freq: Once | INTRAVENOUS | Status: DC | PRN

## 2020-08-16 MED ORDER — ONDANSETRON HCL 4 MG/2ML IJ SOLN
4.0000 mg | Freq: Once | INTRAMUSCULAR | Status: AC
  Administered 2020-08-16: 21:00:00 4 mg via INTRAVENOUS
  Filled 2020-08-16: qty 2

## 2020-08-16 MED ORDER — MAGNESIUM CITRATE PO SOLN: 1.0000 | Freq: Once | ORAL | 0 refills | Status: AC

## 2020-08-16 MED ORDER — ONDANSETRON HCL 4 MG/2ML IJ SOLN
4.0000 mg | Freq: Once | INTRAMUSCULAR | Status: AC
  Administered 2020-08-16: 16:00:00 4 mg via INTRAVENOUS
  Filled 2020-08-16: qty 2

## 2020-08-16 NOTE — ED Notes (Signed)
RN made aware of BP 

## 2020-08-16 NOTE — ED Provider Notes (Signed)
[facility]-EMERGENCY DEPT Provider Note   CSN: [id number] Arrival date & time: 08/16/20  1508     History Chief Complaint  Patient presents with  . Post-op Problem    Howard Morton is a 64 y.o. male.  Patient is about 1 week out from left total knee replacement.  He presents to the ER \\with  chief concern of dizziness episodes.  Symptoms started about 6 days ago and have been persistent.  He noticed them during his physical therapy session.  He states that when he lies still the dizziness is improved when he tries to turn his head to move a certain way he feels very unsteady.  He saw his orthopedist today, and was sent to the ER to evaluate his episodes of dizziness.  Otherwise denies any new pains.  Denies fevers vomiting cough diarrhea.  Denies any new numbness or weakness.        Past Medical History:  Diagnosis Date  . Anxiety   . Asthma   . Benign paroxysmal positional vertigo   . BPH (benign prostatic hyperplasia)   . CHI (closed head injury)   . Eczema   . ED (erectile dysfunction)   . GERD (gastroesophageal reflux disease)   . Hypercholesterolemia   . Insomnia   . Osteoarthritis of both knees   . Pneumonia   . Short-term memory loss   . Thrombocytosis     Patient Active Problem List   Diagnosis Date Noted  . OA (osteoarthritis) of knee 08/07/2020  . Primary osteoarthritis of left knee 08/07/2020    Past Surgical History:  Procedure Laterality Date  . CHOLECYSTECTOMY    . KNEE SURGERY    . TOTAL KNEE ARTHROPLASTY Left 08/07/2020   Procedure: TOTAL KNEE ARTHROPLASTY;  Surgeon: Ollen GrossAluisio, Frank, MD;  Location: [facility];  Service: Orthopedics;  Laterality: Left;  50min       History reviewed. No pertinent family history.  Social History   Tobacco Use  . Smoking status: Never Smoker  . Smokeless tobacco: Never Used  Vaping Use  . Vaping Use: Never used  Substance Use Topics  . Alcohol use: Yes    Comment: Rarely  . Drug use:  Not Currently    Home Medications Prior to Admission medications   Medication Sig Start Date End Date Taking? Authorizing Provider  acetaminophen (TYLENOL) 500 MG tablet Take 1,000 mg by mouth every 6 (six) hours as needed for moderate pain.   Yes [provider]  albuterol (VENTOLIN HFA) 108 (90 Base) MCG/ACT inhaler Inhale 2 puffs into the lungs every 6 (six) hours as needed for wheezing or shortness of breath.   Yes [provider]  aspirin EC 325 MG EC tablet Take 1 tablet (325 mg total) by mouth 2 (two) times daily for 20 days. Then resume one 81 mg aspirin once a day. Patient taking differently: Take 325 mg by mouth 2 (two) times daily. 08/08/20 08/28/20 Yes Edmisten, Kristie L, PA  Cholecalciferol (VITAMIN D3 PO) Take 1 capsule by mouth daily.   Yes [provider]  gabapentin (NEURONTIN) 300 MG capsule Take 600 mg by mouth 2 (two) times daily. 06/05/20  Yes [provider]  loratadine (CLARITIN) 10 MG tablet Take 10 mg by mouth daily as needed for allergies.   Yes [provider]  LORazepam (ATIVAN) 1 MG tablet Take 1 mg by mouth daily as needed for anxiety. 12/23/19  Yes [provider]  meclizine (ANTIVERT) 25 MG tablet Take 25 mg by  mouth 4 (four) times daily as needed for dizziness.   Yes [provider]  methocarbamol (ROBAXIN) 500 MG tablet Take 1 tablet (500 mg total) by mouth every 6 (six) hours as needed for muscle spasms. 08/08/20  Yes Edmisten, Lyn Hollingshead, PA  Multiple Vitamin (MULTIVITAMIN WITH MINERALS) TABS tablet Take 1 tablet by mouth daily.   Yes [provider]  ondansetron (ZOFRAN) 4 MG tablet Take 4 mg by mouth every 8 (eight) hours as needed for nausea or vomiting.   Yes [provider]  oxyCODONE (OXY IR/ROXICODONE) 5 MG immediate release tablet Take 1-2 tablets (5-10 mg total) by mouth every 6 (six) hours as needed for severe pain. 08/08/20  Yes Edmisten, Lyn Hollingshead, PA  senna (SENOKOT) 8.6 MG  TABS tablet Take 2 tablets by mouth daily as needed for mild constipation.   Yes [provider]  traMADol (ULTRAM) 50 MG tablet Take 50-100 mg by mouth every 6 (six) hours as needed for moderate pain. 04/27/20  Yes [provider]  Monte Fantasia INHUB 100-50 MCG/DOSE AEPB Inhale 1 puff into the lungs 2 (two) times daily. 06/13/20  Yes [provider]  zinc gluconate 50 MG tablet Take 50 mg by mouth daily.   Yes [provider]  magnesium citrate SOLN Take 296 mLs (1 Bottle total) by mouth once for 1 dose. 08/16/20 08/16/20  Cheryll Cockayne, MD  ondansetron (ZOFRAN ODT) 4 MG disintegrating tablet Take 1 tablet (4 mg total) by mouth every 8 (eight) hours as needed for nausea or vomiting. 08/16/20   Cheryll Cockayne, MD  sodium phosphate (FLEET) 7-19 GM/118ML ENEM Place 133 mLs (1 enema total) rectally once for 1 dose. 08/16/20 08/16/20  Cheryll Cockayne, MD    Allergies    Depakote [divalproex sodium] and Valproic acid  Review of Systems   Review of Systems  Constitutional: Negative for fever.  HENT: Negative for ear pain and sore throat.   Eyes: Negative for pain.  Respiratory: Negative for cough.   Cardiovascular: Negative for chest pain.  Gastrointestinal: Negative for abdominal pain.  Genitourinary: Negative for flank pain.  Musculoskeletal: Negative for back pain.  Skin: Negative for color change and rash.  Neurological: Negative for syncope.  All other systems reviewed and are negative.   Physical Exam Updated Vital Signs BP (!) 155/102   Pulse 89   Temp 98 F (36.7 C) (Oral)   Resp 17   Ht 5\' 11"  (1.803 m)   Wt 89 kg   SpO2 97%   BMI 27.37 kg/m   Physical Exam Constitutional:      General: He is not in acute distress.    Appearance: He is well-developed.  HENT:     Head: Normocephalic.     Mouth/Throat:     Mouth: Mucous membranes are moist.  Cardiovascular:     Rate and Rhythm: Normal rate.  Pulmonary:     Effort: Pulmonary effort is  normal.  Abdominal:     Palpations: Abdomen is soft.  Musculoskeletal:     Right lower leg: No edema.     Left lower leg: No edema.     Comments: Tenderness palpation the left knee postsurgical site.  Dressings are intact and clean and dry.  Swelling left knee postsurgical site with mild erythema.  No discharge or drainage noted.  Skin:    General: Skin is warm.     Capillary Refill: Capillary refill takes less than 2 seconds.  Neurological:     Mental  Status: He is alert.     Comments: Nerves II through XII intact.  No facial droop noted speech normal.  Strength 5/5 all extremities.  Left lower extremity decreased flexion extension of the knee secondary to postoperative pain, however no weakness noted in the left ankle.     ED Results / Procedures / Treatments   Labs (all labs ordered are listed, but only abnormal results are displayed) Labs Reviewed  BASIC METABOLIC PANEL - Abnormal; Notable for the following components:      Result Value   CO2 21 (*)    All other components within normal limits  CBC WITH DIFFERENTIAL/PLATELET - Abnormal; Notable for the following components:   WBC 21.1 (*)    Hemoglobin 12.0 (*)    HCT 37.3 (*)    Platelets 590 (*)    Neutro Abs 15.4 (*)    Monocytes Absolute 2.7 (*)    Abs Immature Granulocytes 0.35 (*)    All other components within normal limits  RESP PANEL BY RT-PCR (FLU A&B, COVID) ARPGX2  URINALYSIS, ROUTINE W REFLEX MICROSCOPIC    EKG None  Radiology CT Head Wo Contrast  Result Date: 08/16/2020 CLINICAL DATA:  Dizziness. Ortho patient recently head total knee replacement. EXAM: CT HEAD WITHOUT CONTRAST TECHNIQUE: Contiguous axial images were obtained from the base of the skull through the vertex without intravenous contrast. COMPARISON:  None. FINDINGS: Brain: Loss of gray differentiation and cortical edema involving the posterior left frontal lobe and the anterolateral left temporal lobe (posterior left MCA territory),  concerning for acute or early subacute infarct. No acute hemorrhage. No hydrocephalus. No mass lesion. No midline shift. Vascular: Calcific atherosclerosis. No hyperdense vessel identified. Skull: No acute fracture Sinuses/Orbits: Mild mucosal thickening of the inferior right maxillary sinus, scattered ethmoid air cells, and right frontal sinus. Unremarkable orbits. Other: No mastoid effusions. IMPRESSION: Findings concerning for acute or early subacute infarct involving the posterior left frontal lobe and anterolateral left temporal lobe (posterior left MCA territory). Recommend MRI to further evaluate. Findings discussed with Dr. Audley Hose at [facility] via telephone. Electronically Signed   By: Feliberto Harts MD   On: 08/16/2020 17:04   MR BRAIN WO CONTRAST  Result Date: 08/16/2020 CLINICAL DATA:  Neuro deficit, acute, stroke suspected. EXAM: MRI HEAD WITHOUT CONTRAST TECHNIQUE: Multiplanar, multiecho pulse sequences of the brain and surrounding structures were obtained without intravenous contrast. COMPARISON:  Head CT performed earlier today 08/16/2020. FINDINGS: The patient was unable to tolerate the full examination. As a result, only an axial diffusion-weighted sequence and a sagittal T1 weighted sequence could be obtained. The infarct within the left frontoparietal operculum and left temporal lobe described on the head CT performed earlier today is chronic. IMPRESSION: The patient was unable to tolerate the full examination. As a result, only an axial diffusion-weighted sequence and a sagittal T1 weighted sequence could be obtained. No evidence of acute infarction. 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 Electronically Signed   By: Jackey Loge DO   On: 08/16/2020 18:56    Procedures Procedures (including critical care time)  Medications Ordered in ED Medications  gadobutrol (GADAVIST) 1 MMOL/ML injection 9 mL (has no administration in time range)  ondansetron (ZOFRAN) injection 4 mg  (has no administration in time range)  ondansetron (ZOFRAN) injection 4 mg (4 mg Intravenous Given 08/16/20 1604)  sodium chloride 0.9 % bolus 1,000 mL (1,000 mLs Intravenous Bolus 08/16/20 1605)  meclizine (ANTIVERT) tablet 25 mg (25 mg Oral Given 08/16/20 1944)  ED Course  I have reviewed the triage vital signs and the nursing notes.  Pertinent labs & imaging results that were available during my care of the patient were reviewed by me and considered in my medical decision making (see chart for details).    MDM Rules/Calculators/A&P                          CT of the head concerning for acute versus subacute infarct in the frontal and temporal lobes.  Neuro hospitalist have been consulted.  Pending MRI imaging.  MRI of the brain shows no acute infarct but evidence of old infarct.  Patient symptoms improved with meclizine able to tolerate p.o.    Will be discharged home, advised continued meclizine as well as Zofran as needed for nausea.  Advised return if he is unable to keep down fluids or has worsening symptoms.  Patient and wife had multiple questions which were answered and expressed understanding discharged home in stable condition.   Final Clinical Impression(s) / ED Diagnoses Final diagnoses:  Vertigo  Non-intractable vomiting with nausea, unspecified vomiting type    Rx / DC Orders ED Discharge Orders         Ordered    ondansetron (ZOFRAN ODT) 4 MG disintegrating tablet  Every 8 hours PRN        08/16/20 2053    sodium phosphate (FLEET) 7-19 GM/118ML ENEM   Once        08/16/20 2053    magnesium citrate SOLN   Once        08/16/20 2053           Cheryll Cockayne, MD 08/16/20 2054

## 2020-08-16 NOTE — ED Triage Notes (Signed)
Patient BIBA d/t weakness and increased swelling at surgery site. Also reports n/v and constipation. Patient full knee replacement 12/6. Denies fevers or chills.   BP 118/72 P 80 RR 16 SpO2 98% RA CBG 115 T 97.9

## 2020-08-16 NOTE — ED Notes (Signed)
Per PA at [facility]-patient recently had a total knee replacement-now having N/V, no BM in days-possibly dehydrated

## 2020-08-16 NOTE — Discharge Instructions (Signed)
Call your primary care doctor or specialist as discussed in the next 2-3 days.   Return immediately back to the ER if:  Your symptoms worsen within the next 12-24 hours. You develop new symptoms such as new fevers, persistent vomiting, new pain, shortness of breath, or new weakness or numbness, or if you have any other concerns.  

## 2020-08-16 NOTE — Progress Notes (Signed)
Failed attempt at MRI. Pt refused to continue after two sequences.

## 2020-09-25 ENCOUNTER — Other Ambulatory Visit (HOSPITAL_COMMUNITY)
Admission: RE | Admit: 2020-09-25 | Discharge: 2020-09-25 | Disposition: A | Discharge status: Home / Self Care | Payer: Medicare HMO | Source: Ambulatory Visit | Attending: Orthopedic Surgery | Admitting: Orthopedic Surgery

## 2020-09-25 ENCOUNTER — Encounter (HOSPITAL_COMMUNITY): Payer: Self-pay | Admitting: Orthopedic Surgery

## 2020-09-25 ENCOUNTER — Other Ambulatory Visit: Payer: Self-pay

## 2020-09-25 DIAGNOSIS — Z01812 Encounter for preprocedural laboratory examination: Secondary | ICD-10-CM | POA: Insufficient documentation

## 2020-09-25 DIAGNOSIS — Z20822 Contact with and (suspected) exposure to covid-19: Secondary | ICD-10-CM | POA: Insufficient documentation

## 2020-09-25 LAB — SARS CORONAVIRUS 2 (TAT 6-24 HRS): SARS Coronavirus 2: NEGATIVE

## 2020-09-25 NOTE — Progress Notes (Signed)
COVID Vaccine Completed:  x2 Date COVID Vaccine completed:  04-22-20 & 05-13-20 COVID vaccine manufacturer: Pfizer    Moderna   [employer]   PCP - Lynn Ito, MD Cardiologist - N/A  Chest x-ray -  EKG -  Stress Test -  ECHO -  Cardiac Cath -  Pacemaker/ICD device last checked:  Sleep Study - N/A CPAP -   Fasting Blood Sugar - N/A Checks Blood Sugar _____ times a day  Blood Thinner Instructions: Aspirin Instructions:  ASA 81 mg Last Dose:  Anesthesia review:   Patient denies shortness of breath, fever, cough and chest pain at PAT appointment.  Patient able to climb a flight of stairs and perform ADL's without assistance.   Patient is having issues with vertigo since last procedure in December.  Patient verbalized understanding of instructions that were given to them at the PAT appointment. Patient was also instructed that they will need to review over the PAT instructions again at home before surgery.

## 2020-09-26 NOTE — Anesthesia Preprocedure Evaluation (Signed)
Anesthesia Evaluation  Patient identified by MRN, date of birth, ID band Patient awake    Reviewed: Allergy & Precautions, NPO status , Patient's Chart, lab work & pertinent test results  History of Anesthesia Complications (+) PONV and history of anesthetic complications  Airway Mallampati: II  TM Distance: >3 FB Neck ROM: Full    Dental no notable dental hx. (+) Teeth Intact, Dental Advisory Given   Pulmonary asthma ,    Pulmonary exam normal breath sounds clear to auscultation       Cardiovascular Exercise Tolerance: Good negative cardio ROS Normal cardiovascular exam Rhythm:Regular Rate:Normal     Neuro/Psych Anxiety negative neurological ROS     GI/Hepatic negative GI ROS, Neg liver ROS, GERD  ,  Endo/Other  negative endocrine ROS  Renal/GU negative Renal ROS     Musculoskeletal  (+) Arthritis ,   Abdominal   Peds  Hematology negative hematology ROS (+)   Anesthesia Other Findings   Reproductive/Obstetrics                            Anesthesia Physical Anesthesia Plan  ASA: II  Anesthesia Plan: MAC   Post-op Pain Management:    Induction:   PONV Risk Score and Plan: 4 or greater and Treatment may vary due to age or medical condition, Ondansetron, Diphenhydramine and Dexamethasone  Airway Management Planned: Mask  Additional Equipment: None  Intra-op Plan:   Post-operative Plan:   Informed Consent: I have reviewed the patients History and Physical, chart, labs and discussed the procedure including the risks, benefits and alternatives for the proposed anesthesia with the patient or authorized representative who has indicated his/her understanding and acceptance.     Dental advisory given  Plan Discussed with: CRNA and Anesthesiologist  Anesthesia Plan Comments:        Anesthesia Quick Evaluation

## 2020-09-27 ENCOUNTER — Ambulatory Visit (HOSPITAL_COMMUNITY)
Admission: RE | Admit: 2020-09-27 | Discharge: 2020-09-27 | Disposition: A | Discharge status: Home / Self Care | Payer: Medicare HMO | Attending: Orthopedic Surgery | Admitting: Orthopedic Surgery

## 2020-09-27 ENCOUNTER — Ambulatory Visit (HOSPITAL_COMMUNITY): Payer: Medicare HMO | Admitting: Anesthesiology

## 2020-09-27 ENCOUNTER — Encounter (HOSPITAL_COMMUNITY): Payer: Self-pay | Admitting: Orthopedic Surgery

## 2020-09-27 ENCOUNTER — Encounter (HOSPITAL_COMMUNITY)
Admission: RE | Disposition: A | Discharge status: Home / Self Care | Payer: Self-pay | Source: Home / Self Care | Attending: Orthopedic Surgery

## 2020-09-27 DIAGNOSIS — Z96652 Presence of left artificial knee joint: Secondary | ICD-10-CM | POA: Insufficient documentation

## 2020-09-27 DIAGNOSIS — M24662 Ankylosis, left knee: Secondary | ICD-10-CM | POA: Insufficient documentation

## 2020-09-27 DIAGNOSIS — Z79899 Other long term (current) drug therapy: Secondary | ICD-10-CM | POA: Diagnosis not present

## 2020-09-27 DIAGNOSIS — Z7982 Long term (current) use of aspirin: Secondary | ICD-10-CM | POA: Diagnosis not present

## 2020-09-27 HISTORY — PX: KNEE CLOSED REDUCTION: SHX995

## 2020-09-27 HISTORY — DX: Other complications of anesthesia, initial encounter: T88.59XA

## 2020-09-27 SURGERY — MANIPULATION, KNEE, CLOSED
Anesthesia: General | Site: Knee | Laterality: Left

## 2020-09-27 MED ORDER — PROPOFOL 10 MG/ML IV BOLUS
INTRAVENOUS | Status: DC | PRN
  Administered 2020-09-27: 170 mg via INTRAVENOUS

## 2020-09-27 MED ORDER — PROPOFOL 10 MG/ML IV BOLUS
INTRAVENOUS | Status: AC
  Filled 2020-09-27: qty 20

## 2020-09-27 MED ORDER — HYDROMORPHONE HCL 1 MG/ML IJ SOLN: 0.2500 mg | INTRAMUSCULAR | Status: DC | PRN

## 2020-09-27 MED ORDER — CHLORHEXIDINE GLUCONATE 0.12 % MT SOLN
15.0000 mL | Freq: Once | OROMUCOSAL | Status: AC
  Administered 2020-09-27: 15 mL via OROMUCOSAL

## 2020-09-27 MED ORDER — LACTATED RINGERS IV SOLN: INTRAVENOUS | Status: DC

## 2020-09-27 MED ORDER — METHOCARBAMOL 500 MG PO TABS: 500.0000 mg | ORAL_TABLET | Freq: Four times a day (QID) | ORAL | 0 refills | Status: DC | PRN

## 2020-09-27 MED ORDER — SCOPOLAMINE 1 MG/3DAYS TD PT72: 1.0000 | MEDICATED_PATCH | TRANSDERMAL | Status: DC

## 2020-09-27 MED ORDER — ORAL CARE MOUTH RINSE: 15.0000 mL | Freq: Once | OROMUCOSAL | Status: AC

## 2020-09-27 MED ORDER — OXYCODONE HCL 5 MG/5ML PO SOLN: 5.0000 mg | Freq: Once | ORAL | Status: DC | PRN

## 2020-09-27 MED ORDER — CHLORHEXIDINE GLUCONATE 4 % EX LIQD: 60.0000 mL | Freq: Once | CUTANEOUS | Status: DC

## 2020-09-27 MED ORDER — FENTANYL CITRATE (PF) 100 MCG/2ML IJ SOLN
INTRAMUSCULAR | Status: DC | PRN
  Administered 2020-09-27: 50 ug via INTRAVENOUS
  Administered 2020-09-27: 25 ug via INTRAVENOUS

## 2020-09-27 MED ORDER — ONDANSETRON HCL 4 MG/2ML IJ SOLN: 4.0000 mg | Freq: Once | INTRAMUSCULAR | Status: DC | PRN

## 2020-09-27 MED ORDER — POVIDONE-IODINE 10 % EX SWAB: 2.0000 "application " | Freq: Once | CUTANEOUS | Status: DC

## 2020-09-27 MED ORDER — KETOROLAC TROMETHAMINE 30 MG/ML IJ SOLN: 30.0000 mg | Freq: Once | INTRAMUSCULAR | Status: DC | PRN

## 2020-09-27 MED ORDER — FENTANYL CITRATE (PF) 100 MCG/2ML IJ SOLN
INTRAMUSCULAR | Status: AC
  Filled 2020-09-27: qty 2

## 2020-09-27 MED ORDER — OXYCODONE HCL 5 MG PO TABS: 5.0000 mg | ORAL_TABLET | Freq: Once | ORAL | Status: DC | PRN

## 2020-09-27 MED ORDER — LIDOCAINE HCL (CARDIAC) PF 100 MG/5ML IV SOSY
PREFILLED_SYRINGE | INTRAVENOUS | Status: DC | PRN
  Administered 2020-09-27: 100 mg via INTRAVENOUS

## 2020-09-27 SURGICAL SUPPLY — 16 items
BNDG ADH 1X3 SHEER STRL LF (GAUZE/BANDAGES/DRESSINGS) IMPLANT
COVER SURGICAL LIGHT HANDLE (MISCELLANEOUS) ×2 IMPLANT
COVER WAND RF STERILE (DRAPES) IMPLANT
GAUZE SPONGE 4X4 12PLY STRL (GAUZE/BANDAGES/DRESSINGS) IMPLANT
GLOVE BIO SURGEON STRL SZ8 (GLOVE) ×2 IMPLANT
GLOVE SRG 8 PF TXTR STRL LF DI (GLOVE) ×1 IMPLANT
GLOVE SURG SS PI 7.0 STRL IVOR (GLOVE) ×2 IMPLANT
GLOVE SURG UNDER POLY LF SZ7 (GLOVE) ×2 IMPLANT
GLOVE SURG UNDER POLY LF SZ8 (GLOVE) ×1
GOWN STRL REUS W/TWL LRG LVL3 (GOWN DISPOSABLE) ×2 IMPLANT
KIT TURNOVER KIT A (KITS) IMPLANT
NDL SAFETY ECLIPSE 18X1.5 (NEEDLE) IMPLANT
NEEDLE HYPO 18GX1.5 SHARP (NEEDLE)
PENCIL SMOKE EVACUATOR (MISCELLANEOUS) IMPLANT
SWABSTICK PVP IODINE (MISCELLANEOUS) ×2 IMPLANT
SYR CONTROL 10ML LL (SYRINGE) IMPLANT

## 2020-09-27 NOTE — H&P (Signed)
CC- Howard Morton is a 65 y.o. male who presents with left knee stiffness.  HPI- . Knee Pain: Patient presents with stiffness involving the  left knee. Onset of the symptoms was several weeks ago. Inciting event: He had a left Total Knee Arthroplasty on 08/07/20 and has had significant stiffness in the knee since then. He has had adequate physical therapy but has not had any significant improvements in range of motion of the knee. He presents for closed manipulation.   Past Medical History:  Diagnosis Date  . Anxiety   . Asthma   . Benign paroxysmal positional vertigo   . BPH (benign prostatic hyperplasia)   . CHI (closed head injury)   . Complication of anesthesia    Vertigo after knee surgery  . Eczema   . ED (erectile dysfunction)   . GERD (gastroesophageal reflux disease)   . Hypercholesterolemia   . Insomnia   . Osteoarthritis of both knees   . Pneumonia   . Short-term memory loss   . Thrombocytosis     Past Surgical History:  Procedure Laterality Date  . CHOLECYSTECTOMY    . KNEE SURGERY    . TOTAL KNEE ARTHROPLASTY Left 08/07/2020   Procedure: TOTAL KNEE ARTHROPLASTY;  Surgeon: Ollen Gross, MD;  Location: [facility];  Service: Orthopedics;  Laterality: Left;     Prior to Admission medications   Medication Sig Start Date End Date Taking? Authorizing Provider  acetaminophen (TYLENOL) 500 MG tablet Take 1,000 mg by mouth every 6 (six) hours as needed for moderate pain.   Yes [provider]  albuterol (VENTOLIN HFA) 108 (90 Base) MCG/ACT inhaler Inhale 2 puffs into the lungs every 6 (six) hours as needed for wheezing or shortness of breath.   Yes [provider]  aspirin EC 81 MG tablet Take 81 mg by mouth daily. Swallow whole.   Yes [provider]  Cholecalciferol (VITAMIN D3 PO) Take 1 capsule by mouth daily.   Yes [provider]  diclofenac Sodium (VOLTAREN) 1 % GEL Apply 1 application topically 4 (four) times daily as needed  (pain.).   Yes [provider]  gabapentin (NEURONTIN) 300 MG capsule Take 600 mg by mouth 2 (two) times daily. 06/05/20  Yes [provider]  hydrocortisone cream 1 % Apply 1 application topically 2 (two) times daily as needed (dry/itchy skin.).   Yes [provider]  loratadine (CLARITIN) 10 MG tablet Take 10 mg by mouth daily.   Yes [provider]  LORazepam (ATIVAN) 1 MG tablet Take 0.5 mg by mouth daily as needed for anxiety. 12/23/19  Yes [provider]  meclizine (ANTIVERT) 25 MG tablet Take 25 mg by mouth 4 (four) times daily as needed for dizziness.   Yes [provider]  Multiple Vitamin (MULTIVITAMIN WITH MINERALS) TABS tablet Take 1 tablet by mouth daily.   Yes [provider]  naproxen sodium (ALEVE) 220 MG tablet Take 440 mg by mouth 2 (two) times daily as needed (pain.).   Yes [provider]  traMADol (ULTRAM) 50 MG tablet Take 50-100 mg by mouth every 6 (six) hours as needed for moderate pain. 04/27/20  Yes [provider]  Monte Fantasia INHUB 100-50 MCG/DOSE AEPB Inhale 1 puff into the lungs 2 (two) times daily. 06/13/20  Yes [provider]  zinc gluconate 50 MG tablet Take 50 mg by mouth daily.   Yes [provider]  methocarbamol (ROBAXIN) 500 MG tablet Take 1 tablet (500 mg total) by  mouth every 6 (six) hours as needed for muscle spasms. Patient not taking: Reported on 09/25/2020 08/08/20   Edmisten, Kristie L, PA  ondansetron (ZOFRAN ODT) 4 MG disintegrating tablet Take 1 tablet (4 mg total) by mouth every 8 (eight) hours as needed for nausea or vomiting. Patient not taking: Reported on 09/25/2020 08/16/20   Cheryll Cockayne, MD  oxyCODONE (OXY IR/ROXICODONE) 5 MG immediate release tablet Take 1-2 tablets (5-10 mg total) by mouth every 6 (six) hours as needed for severe pain. Patient not taking: Reported on 09/25/2020 08/08/20   Derenda Fennel, PA   Left Knee Exam antalgic gait, no  warmth or effusion, reduced range of motion (10-70), collateral ligaments intact  Physical Examination: General appearance - alert, well appearing, and in no distress Mental status - alert, oriented to person, place, and time Chest - clear to auscultation, no wheezes, rales or rhonchi, symmetric air entry Heart - normal rate, regular rhythm, normal S1, S2, no murmurs, rubs, clicks or gallops Abdomen - soft, nontender, nondistended, no masses or organomegaly Neurological - alert, oriented, normal speech, no focal findings or movement disorder noted   Asessment/Plan--- Left knee arthrofibrosis- - Plan left knee closed manipulation. Procedure risks and potential comps discussed with patient who elects to proceed. Goals are decreased pain and increased function with a high likelihood of achieving both

## 2020-09-27 NOTE — Transfer of Care (Signed)
Immediate Anesthesia Transfer of Care Note  Patient: Howard Morton  Procedure(s) Performed: CLOSED MANIPULATION KNEE (Left Knee)  Patient Location: PACU  Anesthesia Type:MAC  Level of Consciousness: awake, alert , oriented and patient cooperative  Airway & Oxygen Therapy: Patient Spontanous Breathing and Patient connected to face mask oxygen  Post-op Assessment: Report given to RN and Post -op Vital signs reviewed and stable  Post vital signs: Reviewed and stable  Last Vitals:  Vitals Value Taken Time  BP 135/75 09/27/20 1320  Temp    Pulse 82 09/27/20 1321  Resp 18 09/27/20 1321  SpO2 100 % 09/27/20 1321  Vitals shown include unvalidated device data.  Last Pain:  Vitals:   09/27/20 1144  TempSrc:   PainSc: 0-No pain         Complications: No complications documented.

## 2020-09-27 NOTE — Anesthesia Postprocedure Evaluation (Signed)
Anesthesia Post Note  Patient: Howard Morton  Procedure(s) Performed: CLOSED MANIPULATION KNEE (Left Knee)     Patient location during evaluation: PACU Anesthesia Type: MAC Level of consciousness: awake and alert Pain management: pain level controlled Vital Signs Assessment: post-procedure vital signs reviewed and stable Respiratory status: spontaneous breathing, nonlabored ventilation, respiratory function stable and patient connected to nasal cannula oxygen Cardiovascular status: stable and blood pressure returned to baseline Postop Assessment: no apparent nausea or vomiting Anesthetic complications: no   No complications documented.  Last Vitals:  Vitals:   09/27/20 1330 09/27/20 1337  BP: 136/79 139/82  Pulse: 80 77  Resp: 15 18  Temp:    SpO2: 98% 96%    Last Pain:  Vitals:   09/27/20 1144  TempSrc:   PainSc: 0-No pain                 Barnet Glasgow

## 2020-09-27 NOTE — Op Note (Signed)
  OPERATIVE REPORT   PREOPERATIVE DIAGNOSIS: Arthrofibrosis, Left  knee.   POSTOPERATIVE DIAGNOSIS: Arthrofibrosis, Left knee.   PROCEDURE:  Left  knee closed manipulation.   SURGEON: Ollen Gross, MD   ASSISTANT: None.   ANESTHESIA: General.   COMPLICATIONS: None.   CONDITION: Stable to Recovery.   Pre-manipulation range of motion is 5-75.  Post-manipulation range of  Motion is 0-120  PROCEDURE IN DETAIL: After successful administration of general  anesthetic, exam under anesthesia was performed showing range of motion  5-75 degrees. I then placed my chest against the proximal tibia,  flexing the knee with audible lysis of adhesions. I was easily able to  get the knee flexed to 120  degrees. I then put the knee back in extension and with some  patellar manipulation and gentle pressure got to full  Extension.The patient was subsequently awakened and transported to Recovery in  stable condition.

## 2020-09-27 NOTE — Anesthesia Postprocedure Evaluation (Signed)
Anesthesia Post Note  Patient: Howard Morton  Procedure(s) Performed: CLOSED MANIPULATION KNEE (Left Knee)     Patient location during evaluation: PACU Anesthesia Type: General Level of consciousness: awake and alert Pain management: pain level controlled Vital Signs Assessment: post-procedure vital signs reviewed and stable Respiratory status: spontaneous breathing, nonlabored ventilation, respiratory function stable and patient connected to nasal cannula oxygen Cardiovascular status: blood pressure returned to baseline and stable Postop Assessment: no apparent nausea or vomiting Anesthetic complications: no   No complications documented.  Last Vitals:  Vitals:   09/27/20 1330 09/27/20 1337  BP: 136/79 139/82  Pulse: 80 77  Resp: 15 18  Temp:    SpO2: 98% 96%    Last Pain:  Vitals:   09/27/20 1144  TempSrc:   PainSc: 0-No pain                 Trevor Iha

## 2020-09-27 NOTE — Addendum Note (Signed)
Addendum  created 09/27/20 1354 by Trevor Iha, MD   Clinical Note Signed

## 2020-09-28 ENCOUNTER — Encounter (HOSPITAL_COMMUNITY): Payer: Self-pay | Admitting: Orthopedic Surgery

## 2021-02-09 IMAGING — CT CT HEAD W/O CM
3 series · 15 of 47 positions shown, 18 images · non-contrast
Comparison: None.

CLINICAL DATA: Dizziness. Ortho patient recently head total knee
replacement.

EXAM:
CT HEAD WITHOUT CONTRAST
TECHNIQUE: Contiguous axial images were obtained from the base of the skull
through the vertex without intravenous contrast.

[Series 2: head wo · axial · 0.46mm/px · z∈[-151,-16]mm · 9 of 33 slices shown, 12 images]
[im 3/33  brain]
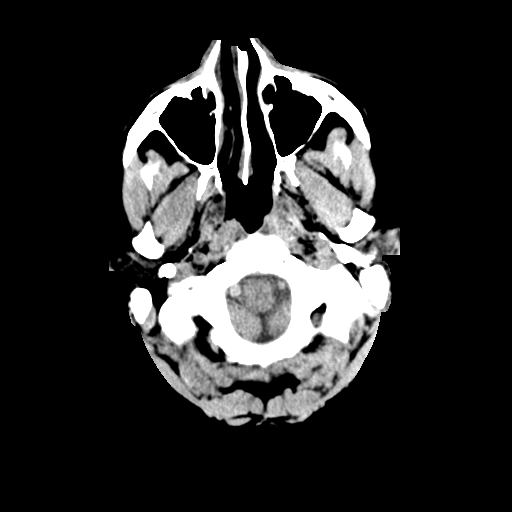
[im 3/33  bone]
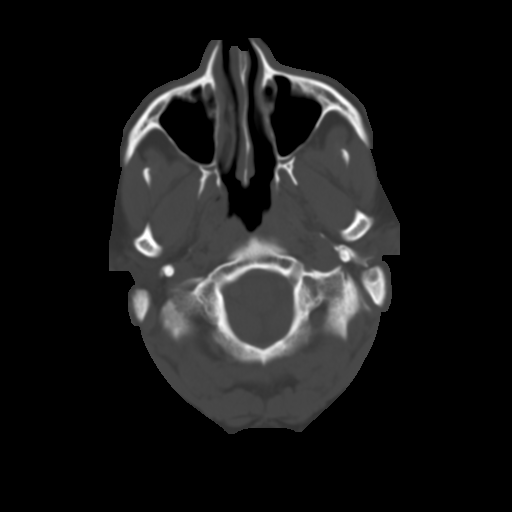
[im 6/33  brain]
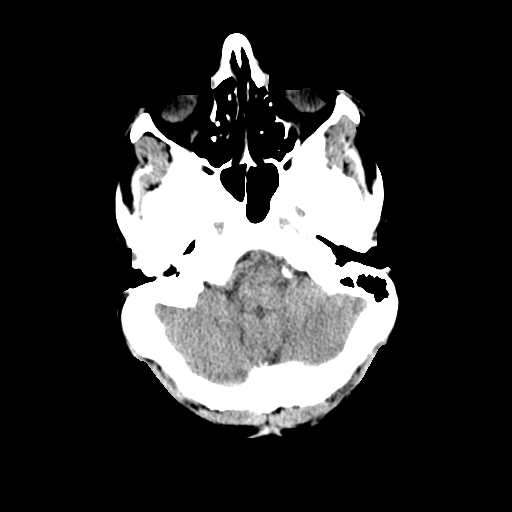
[im 9/33  brain]
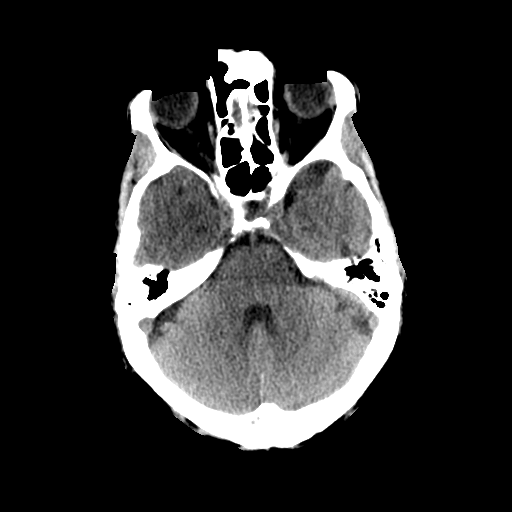
[im 13/33  brain]
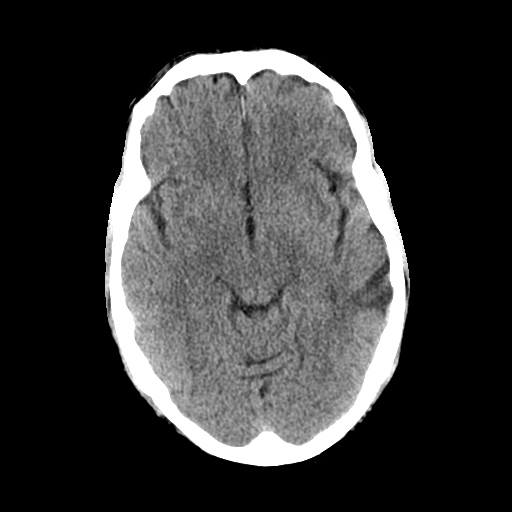
[im 17/33  brain]
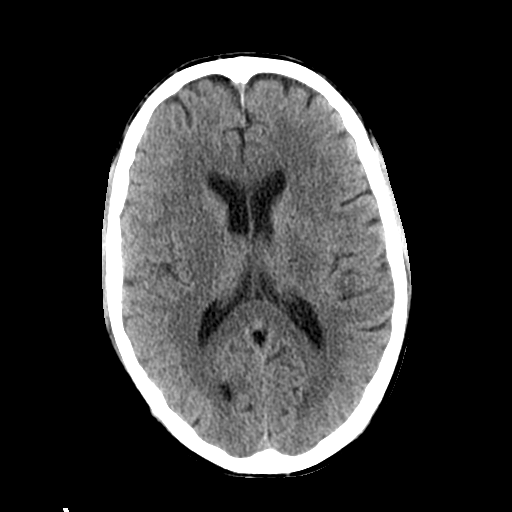
[im 17/33  bone]
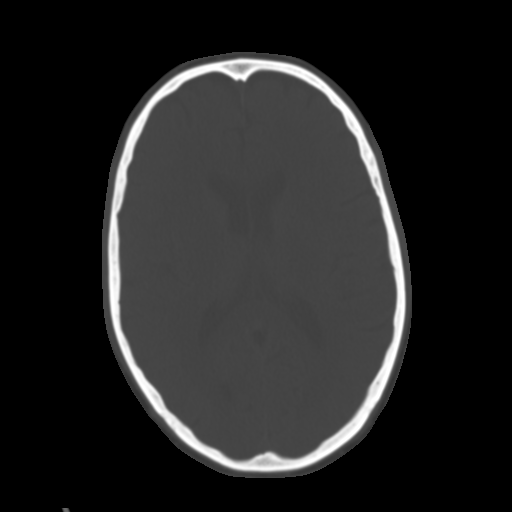
[im 20/33  brain]
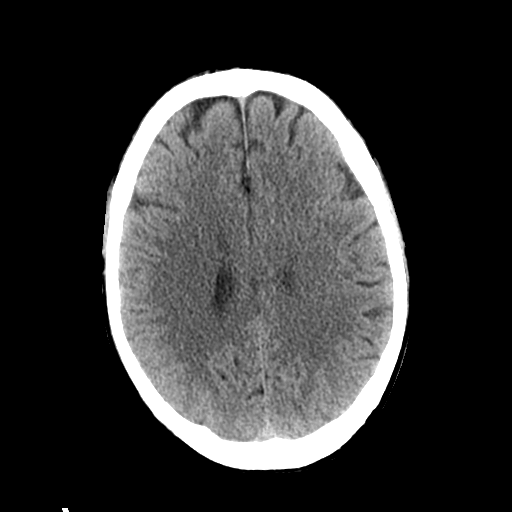
[im 24/33  brain]
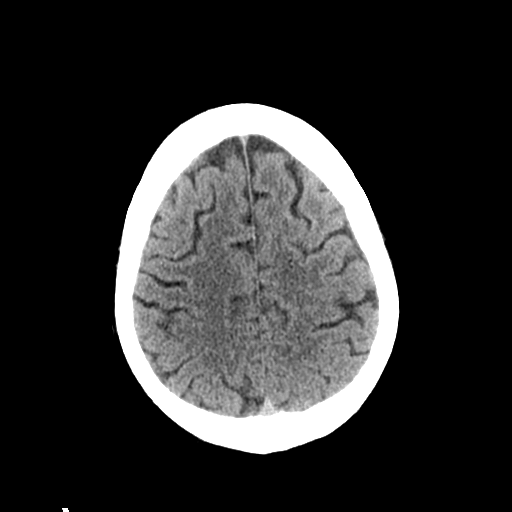
[im 27/33  brain]
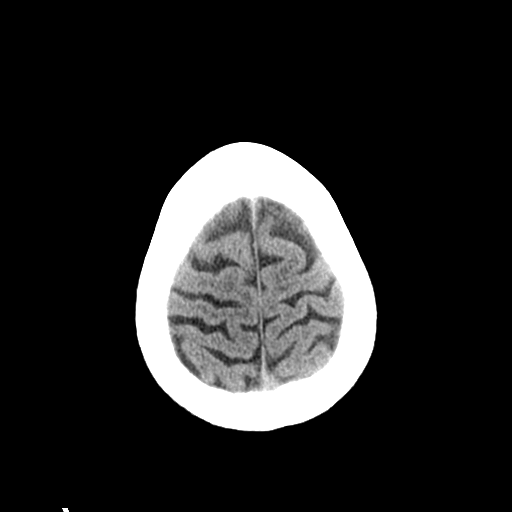
[im 30/33  brain]
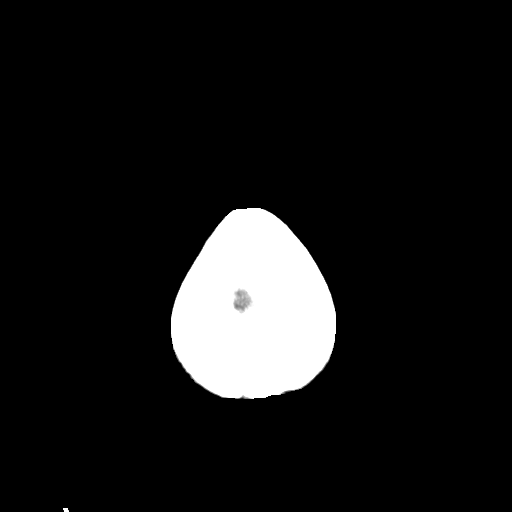
[im 30/33  bone]
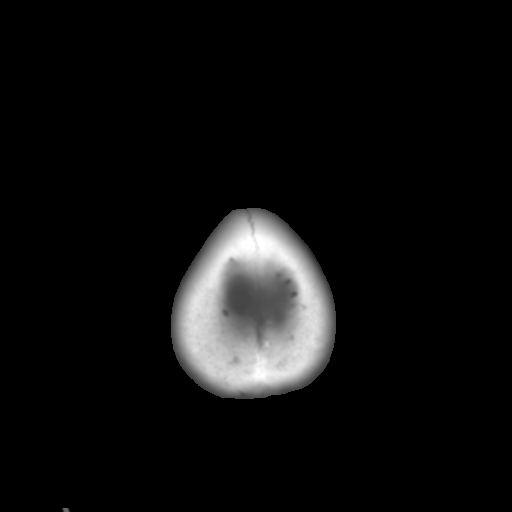

[Series 5: coronal soft tissue · coronal · 0.32mm/px · 3 of 70 slices shown]
[im 24/70  brain]
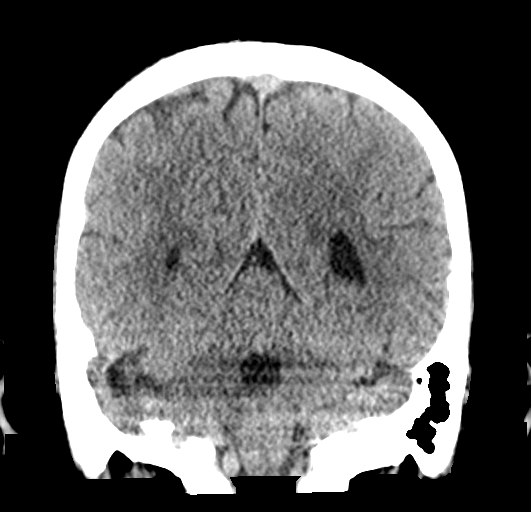
[im 31/70  brain]
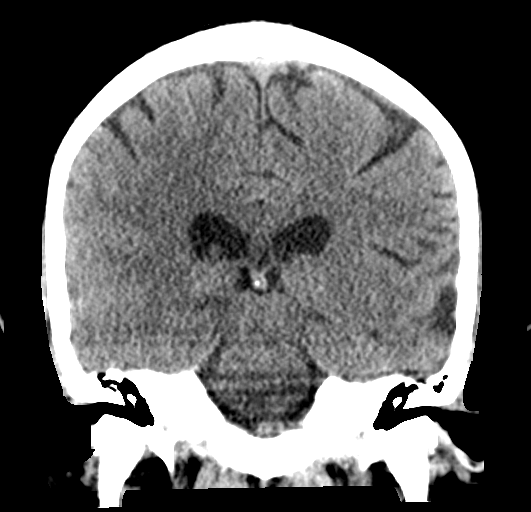
[im 39/70  brain]
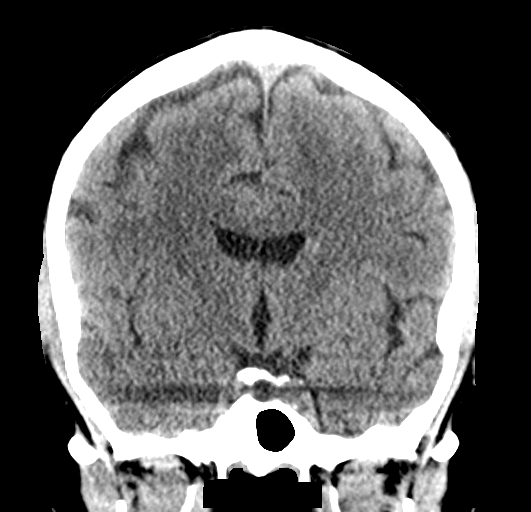

[Series 6: sagittal soft tissue · sagittal · 0.32mm/px · 3 of 57 slices shown]
[im 19/57  brain]
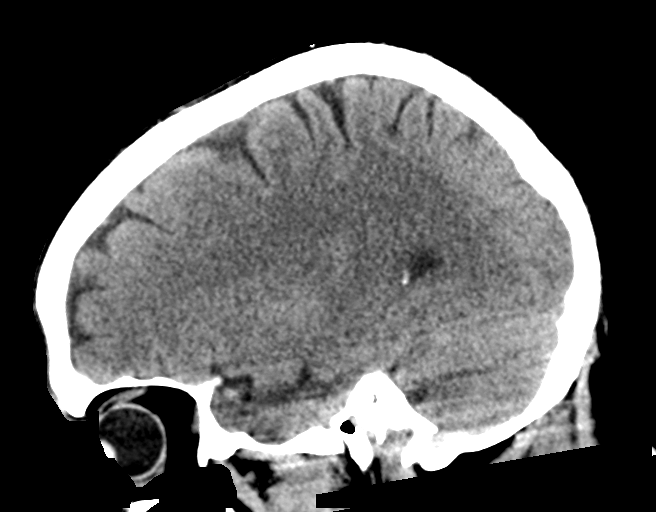
[im 29/57  brain]
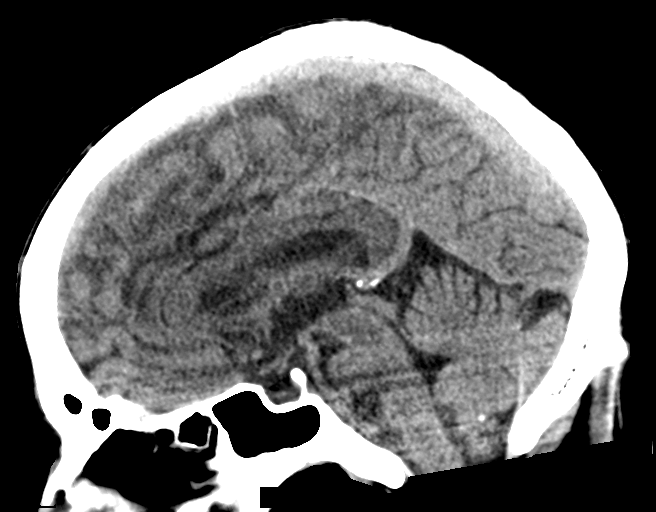
[im 38/57  brain]
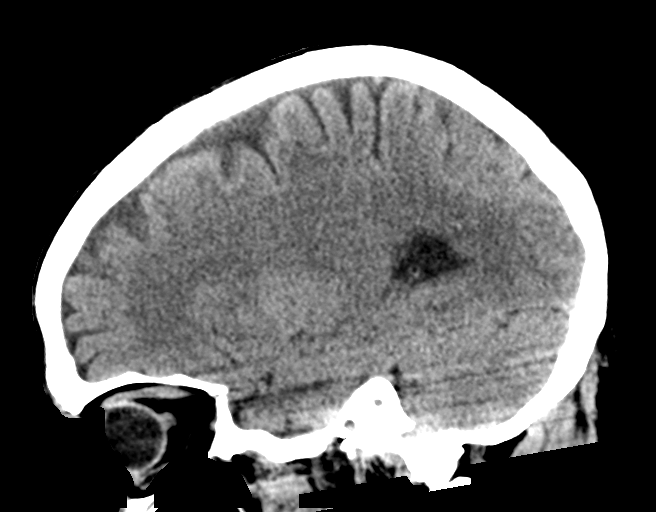

[15 of 47 positions shown; findings below may reference images not displayed]

FINDINGS: Brain: Loss of gray differentiation and cortical edema involving the
posterior left frontal lobe and the anterolateral left temporal lobe
(posterior left MCA territory), concerning for acute or early
subacute infarct. No acute hemorrhage. No hydrocephalus. No mass
lesion. No midline shift.

Vascular: Calcific atherosclerosis. No hyperdense vessel identified.

Skull: No acute fracture

Sinuses/Orbits: Mild mucosal thickening of the inferior right
maxillary sinus, scattered ethmoid air cells, and right frontal
sinus. Unremarkable orbits.

Other: No mastoid effusions.
IMPRESSION: Findings concerning for acute or early subacute infarct involving
the posterior left frontal lobe and anterolateral left temporal lobe
(posterior left MCA territory). Recommend MRI to further evaluate.

Findings discussed with Dr. Locklear at [DATE] via telephone.

## 2022-08-05 NOTE — H&P (Cosign Needed Addendum)
TOTAL KNEE ADMISSION H&P  Patient is being admitted for right total knee arthroplasty.  Subjective:  Chief Complaint: Right knee pain.  HPI: Howard Morton, 66 y.o. male has a history of pain and functional disability in the right knee due to arthritis and has failed non-surgical conservative treatments for greater than 12 weeks to include NSAID's and/or analgesics, corticosteriod injections, and activity modification. Onset of symptoms was gradual, starting  several  years ago with gradually worsening course since that time. The patient noted no past surgery on the right knee.  Patient currently rates pain in the right knee at 7 out of 10 with activity. Patient has night pain, worsening of pain with activity and weight bearing, pain with passive range of motion, and crepitus. Patient has evidence of  significant joint space narrowing in the medial compartment, that has progressed quite a bit compared to previous x-rays taken in 2021  by imaging studies. There is no active infection.  Patient Active Problem List   Diagnosis Date Noted   OA (osteoarthritis) of knee 08/07/2020   Primary osteoarthritis of left knee 08/07/2020    Past Medical History:  Diagnosis Date   Anxiety    Asthma    Benign paroxysmal positional vertigo    BPH (benign prostatic hyperplasia)    CHI (closed head injury)    Complication of anesthesia    Vertigo after knee surgery   Eczema    ED (erectile dysfunction)    GERD (gastroesophageal reflux disease)    Hypercholesterolemia    Insomnia    Osteoarthritis of both knees    Pneumonia    Short-term memory loss    Thrombocytosis     Past Surgical History:  Procedure Laterality Date   CHOLECYSTECTOMY     KNEE CLOSED REDUCTION Left 09/27/2020   Procedure: CLOSED MANIPULATION KNEE;  Surgeon: Ollen Gross, MD;  Location: [facility];  Service: Orthopedics;  Laterality: Left;    KNEE SURGERY     TOTAL KNEE ARTHROPLASTY Left 08/07/2020   Procedure: TOTAL KNEE  ARTHROPLASTY;  Surgeon: Ollen Gross, MD;  Location: [facility];  Service: Orthopedics;  Laterality: Left;     Prior to Admission medications   Medication Sig Start Date End Date Taking? Authorizing Provider  acetaminophen (TYLENOL) 500 MG tablet Take 1,000 mg by mouth every 6 (six) hours as needed for moderate pain.    [provider]  albuterol (VENTOLIN HFA) 108 (90 Base) MCG/ACT inhaler Inhale 2 puffs into the lungs every 6 (six) hours as needed for wheezing or shortness of breath.    [provider]  aspirin EC 81 MG tablet Take 81 mg by mouth daily. Swallow whole.    [provider]  Cholecalciferol (VITAMIN D3 PO) Take 1 capsule by mouth daily.    [provider]  diclofenac Sodium (VOLTAREN) 1 % GEL Apply 1 application topically 4 (four) times daily as needed (pain.).    [provider]  gabapentin (NEURONTIN) 300 MG capsule Take 600 mg by mouth 2 (two) times daily. 06/05/20   [provider]  hydrocortisone cream 1 % Apply 1 application topically 2 (two) times daily as needed (dry/itchy skin.).    [provider]  loratadine (CLARITIN) 10 MG tablet Take 10 mg by mouth daily.    [provider]  LORazepam (ATIVAN) 1 MG tablet Take 0.5 mg by mouth daily as needed for anxiety. 12/23/19   [provider]  meclizine (ANTIVERT) 25 MG tablet Take 25 mg by mouth  4 (four) times daily as needed for dizziness.    [provider]  methocarbamol (ROBAXIN) 500 MG tablet Take 1 tablet (500 mg total) by mouth every 6 (six) hours as needed for muscle spasms. 09/27/20   Zayquan Bogard, Lyn Hollingshead, PA  Multiple Vitamin (MULTIVITAMIN WITH MINERALS) TABS tablet Take 1 tablet by mouth daily.    [provider]  naproxen sodium (ALEVE) 220 MG tablet Take 440 mg by mouth 2 (two) times daily as needed (pain.).    [provider]  traMADol (ULTRAM) 50 MG tablet Take 50-100 mg by mouth every 6 (six) hours as needed  for moderate pain. 04/27/20   [provider]  WIXELA INHUB 100-50 MCG/DOSE AEPB Inhale 1 puff into the lungs 2 (two) times daily. 06/13/20   [provider]  zinc gluconate 50 MG tablet Take 50 mg by mouth daily.    [provider]    Allergies  Allergen Reactions   Depakote [Divalproex Sodium] Rash   Valproic Acid Rash    Rash    Social History   Socioeconomic History   Marital status: Married    Spouse name: Not on file   Number of children: Not on file   Years of education: Not on file   Highest education level: Not on file  Occupational History   Not on file  Tobacco Use   Smoking status: Never   Smokeless tobacco: Never  Vaping Use   Vaping Use: Never used  Substance and Sexual Activity   Alcohol use: Yes    Comment: Rarely   Drug use: Not Currently   Sexual activity: Not on file  Other Topics Concern   Not on file  Social History Narrative   Not on file   Social Determinants of Health   Financial Resource Strain: Not on file  Food Insecurity: Not on file  Transportation Needs: Not on file  Physical Activity: Not on file  Stress: Not on file  Social Connections: Not on file  Intimate Partner Violence: Not on file    Tobacco Use: Low Risk  (09/28/2020)   Patient History    Smoking Tobacco Use: Never    Smokeless Tobacco Use: Never    Passive Exposure: Not on file   Social History   Substance and Sexual Activity  Alcohol Use Yes   Comment: Rarely    No family history on file.  Review of Systems  Constitutional:  Negative for chills and fever.  HENT:  Negative for congestion, sore throat and tinnitus.   Eyes:  Negative for double vision, photophobia and pain.  Respiratory:  Negative for cough, shortness of breath and wheezing.   Cardiovascular:  Negative for chest pain, palpitations and orthopnea.  Gastrointestinal:  Negative for heartburn, nausea and vomiting.  Genitourinary:  Negative for dysuria, frequency and  urgency.  Musculoskeletal:  Positive for joint pain.  Neurological:  Negative for dizziness, weakness and headaches.    Objective:  Physical Exam: Well nourished and well developed.  General: Alert and oriented x3, cooperative and pleasant, no acute distress.  Head: normocephalic, atraumatic, neck supple.  Eyes: EOMI.  Musculoskeletal:  Right Knee Exam:  No tenderness to palpation about the medial or lateral joint line of the right knee.  AROM 0-125 degrees.  No effusion noted.  No instability.  Moderate patellofemoral crepitus.   Calves soft and nontender. Motor function intact in LE. Strength 5/5 LE bilaterally. Neuro: Distal pulses 2+. Sensation to light touch intact in LE.   Imaging  Review Plain radiographs demonstrate severe degenerative joint disease of the right knee. The overall alignment is mild varus. The bone quality appears to be adequate for age and reported activity level.  Assessment/Plan:  End stage arthritis, right knee   The patient history, physical examination, clinical judgment of the provider and imaging studies are consistent with end stage degenerative joint disease of the right knee and total knee arthroplasty is deemed medically necessary. The treatment options including medical management, injection therapy arthroscopy and arthroplasty were discussed at length. The risks and benefits of total knee arthroplasty were presented and reviewed. The risks due to aseptic loosening, infection, stiffness, patella tracking problems, thromboembolic complications and other imponderables were discussed. The patient acknowledged the explanation, agreed to proceed with the plan and consent was signed. Patient is being admitted for inpatient treatment for surgery, pain control, PT, OT, prophylactic antibiotics, VTE prophylaxis, progressive ambulation and ADLs and discharge planning. The patient is planning to be discharged  home .   Patient's anticipated LOS is less than  2 midnights, meeting these requirements: - Younger than 31 - Lives within 1 hour of care - Has a competent adult at home to recover with post-op recover - NO history of  - Chronic pain requiring opiods  - Diabetes  - Coronary Artery Disease  - Heart failure  - Heart attack  - Stroke  - DVT/VTE  - Cardiac arrhythmia  - Respiratory Failure/COPD  - Renal failure  - Anemia  - Advanced Liver disease  Therapy Plans: Outpatient therapy at [facility] ([place])  Disposition: Home with wife Planned DVT Prophylaxis: Aspirin 325 mg BID DME Needed: None PCP: Lanny Hurst, DO (clearance received) TXA: IV Allergies: Depakote (rash) Anesthesia Concerns: None BMI: 30 Last HgbA1c: Not diabetic.  [facility]  Other: - Requests meclizine and disintegrating zofran prescriptions be sent home from hospital. Had severe issues with dizziness/vomiting following left TKA that responded well to these meds. - Dizziness began on POD #2  - Patient was instructed on what medications to stop prior to surgery. - Follow-up visit in 2 weeks with Dr. Lequita Halt - Begin physical therapy following surgery - Pre-operative lab work as pre-surgical testing - Prescriptions will be provided in hospital at time of discharge  Arther Abbott, PA-C Orthopedic Surgery [facility] [place]

## 2022-08-07 NOTE — Patient Instructions (Signed)
SURGICAL WAITING ROOM VISITATION Patients having surgery or a procedure may have no more than 2 support people in the waiting area - these visitors may rotate in the visitor waiting room.   Children under the age of 88 must have an adult with them who is not the patient. If the patient needs to stay at the hospital during part of their recovery, the visitor guidelines for inpatient rooms apply.  PRE-OP VISITATION  Pre-op nurse will coordinate an appropriate time for 1 support person to accompany the patient in pre-op.  This support person may not rotate.  This visitor will be contacted when the time is appropriate for the visitor to come back in the pre-op area.  Please refer to the [facility] website for the visitor guidelines for Inpatients (after your surgery is over and you are in a regular room).  You are not required to quarantine at this time prior to your surgery. However, you must do this: Hand Hygiene often Do NOT share personal items Notify your provider if you are in close contact with someone who has COVID or you develop fever 100.4 or greater, new onset of sneezing, cough, sore throat, shortness of breath or body aches.  If you test positive for Covid or have been in contact with anyone that has tested positive in the last 10 days please notify you surgeon.    Your procedure is scheduled on:  Monday  August 19, 2022  Report to [facility] Main Entrance: [place] entrance where the [employer] is available.   Report to admitting at: 05:45 AM  +++++Call this number if you have any questions or problems the morning of surgery [phone]  Do not eat food after Midnight the night prior to your surgery/procedure.  After Midnight you may have the following liquids until  05:15   AM DAY OF SURGERY  Clear Liquid Diet Water Black Coffee (sugar ok, NO MILK/CREAM OR CREAMERS)  Tea (sugar ok, NO MILK/CREAM OR CREAMERS) regular and decaf                              Plain Jell-O  with no fruit (NO RED)                                           Fruit ices (not with fruit pulp, NO RED)                                     Popsicles (NO RED)                                                                  Juice: apple, WHITE grape, WHITE cranberry Sports drinks like Gatorade or Powerade (NO RED)                 The day of surgery:  Drink ONE (1) Pre-Surgery Clear Ensure  at   05:15 AM the morning of surgery. Drink in one sitting. Do not sip.  This drink was given to you  during your hospital pre-op appointment visit. Nothing else to drink after completing the Pre-Surgery Clear Ensure : No candy, chewing gum or throat lozenges.    FOLLOW ANY ADDITIONAL PRE OP INSTRUCTIONS YOU RECEIVED FROM YOUR SURGEON'S OFFICE!!!   Oral Hygiene is also important to reduce your risk of infection.        Remember - BRUSH YOUR TEETH THE MORNING OF SURGERY WITH YOUR REGULAR TOOTHPASTE  Take ONLY these medicines the morning of surgery with A SIP OF WATER: Gabapentin, Loratadine (Claritin) and use your Wixela Inhaler. If needed, you may take Tylenol, Lorazepam (Ativan) and use your Albuterol Inhaler.   If You have been diagnosed with Sleep Apnea - Bring CPAP mask and tubing day of surgery. We will provide you with a CPAP machine on the day of your surgery.                   You may not have any metal on your body including  jewelry, and body piercing  Do not wear lotions, powders, cologne, or deodorant  Men may shave face and neck.  Contacts, Hearing Aids, dentures or bridgework may not be worn into surgery. DENTURES WILL BE REMOVED PRIOR TO SURGERY PLEASE DO NOT APPLY "Poly grip" OR ADHESIVES!!!  You may bring a small overnight bag with you on the day of surgery, only pack items that are not valuable .[facility] IS NOT RESPONSIBLE   FOR VALUABLES THAT ARE LOST OR STOLEN.   Do not bring your home medications to the hospital. The Pharmacy will dispense medications listed  on your medication list to you during your admission in the Hospital.  Special Instructions: Bring a copy of your healthcare power of attorney and living will documents the day of surgery, if you wish to have them scanned into your [facility] Records- EPIC  Please read over the following fact sheets you were given: IF YOU HAVE QUESTIONS ABOUT YOUR PRE-OP INSTRUCTIONS, PLEASE CALL [phone]  ([place])   [facility] - Preparing for Surgery Before surgery, you can play an important role.  Because skin is not sterile, your skin needs to be as free of germs as possible.  You can reduce the number of germs on your skin by washing with CHG (chlorahexidine gluconate) soap before surgery.  CHG is an antiseptic cleaner which kills germs and bonds with the skin to continue killing germs even after washing. Please DO NOT use if you have an allergy to CHG or antibacterial soaps.  If your skin becomes reddened/irritated stop using the CHG and inform your nurse when you arrive at Short Stay. Do not shave (including legs and underarms) for at least 48 hours prior to the first CHG shower.  You may shave your face/neck.  Please follow these instructions carefully:  1.  Shower with CHG Soap the night before surgery and the  morning of surgery.  2.  If you choose to wash your hair, wash your hair first as usual with your normal  shampoo.  3.  After you shampoo, rinse your hair and body thoroughly to remove the shampoo.                             4.  Use CHG as you would any other liquid soap.  You can apply chg directly to the skin and wash.  Gently with a scrungie or clean washcloth.  5.  Apply the CHG Soap to your body  ONLY FROM THE NECK DOWN.   Do not use on face/ open                           Wound or open sores. Avoid contact with eyes, ears mouth and genitals (private parts).                       Wash face,  Genitals (private parts) with your normal soap.             6.  Wash thoroughly, paying  special attention to the area where your  surgery  will be performed.  7.  Thoroughly rinse your body with warm water from the neck down.  8.  DO NOT shower/wash with your normal soap after using and rinsing off the CHG Soap.            9.  Pat yourself dry with a clean towel.            10.  Wear clean pajamas.            11.  Place clean sheets on your bed the night of your first shower and do not  sleep with pets.  ON THE DAY OF SURGERY : Do not apply any lotions/deodorants the morning of surgery.  Please wear clean clothes to the hospital/surgery center.    FAILURE TO FOLLOW THESE INSTRUCTIONS MAY RESULT IN THE CANCELLATION OF YOUR SURGERY  PATIENT SIGNATURE_________________________________  NURSE SIGNATURE__________________________________  ________________________________________________________________________        Rogelia Mire    An incentive spirometer is a tool that can help keep your lungs clear and active. This tool measures how well you are filling your lungs with each breath. Taking long deep breaths may help reverse or decrease the chance of developing breathing (pulmonary) problems (especially infection) following: A long period of time when you are unable to move or be active. BEFORE THE PROCEDURE  If the spirometer includes an indicator to show your best effort, your nurse or respiratory therapist will set it to a desired goal. If possible, sit up straight or lean slightly forward. Try not to slouch. Hold the incentive spirometer in an upright position. INSTRUCTIONS FOR USE  Sit on the edge of your bed if possible, or sit up as far as you can in bed or on a chair. Hold the incentive spirometer in an upright position. Breathe out normally. Place the mouthpiece in your mouth and seal your lips tightly around it. Breathe in slowly and as deeply as possible, raising the piston or the ball toward the top of the column. Hold your breath for 3-5 seconds  or for as long as possible. Allow the piston or ball to fall to the bottom of the column. Remove the mouthpiece from your mouth and breathe out normally. Rest for a few seconds and repeat Steps 1 through 7 at least 10 times every 1-2 hours when you are awake. Take your time and take a few normal breaths between deep breaths. The spirometer may include an indicator to show your best effort. Use the indicator as a goal to work toward during each repetition. After each set of 10 deep breaths, practice coughing to be sure your lungs are clear. If you have an incision (the cut made at the time of surgery), support your incision when coughing by placing a pillow or rolled up towels firmly against it. Once you are able to  get out of bed, walk around indoors and cough well. You may stop using the incentive spirometer when instructed by your caregiver.  RISKS AND COMPLICATIONS Take your time so you do not get dizzy or light-headed. If you are in pain, you may need to take or ask for pain medication before doing incentive spirometry. It is harder to take a deep breath if you are having pain. AFTER USE Rest and breathe slowly and easily. It can be helpful to keep track of a log of your progress. Your caregiver can provide you with a simple table to help with this. If you are using the spirometer at home, follow these instructions: SEEK MEDICAL CARE IF:  You are having difficultly using the spirometer. You have trouble using the spirometer as often as instructed. Your pain medication is not giving enough relief while using the spirometer. You develop fever of 100.5 F (38.1 C) or higher.                                                                                                    SEEK IMMEDIATE MEDICAL CARE IF:  You cough up bloody sputum that had not been present before. You develop fever of 102 F (38.9 C) or greater. You develop worsening pain at or near the incision site. MAKE SURE YOU:   Understand these instructions. Will watch your condition. Will get help right away if you are not doing well or get worse. Document Released: 12/30/2006 Document Revised: 11/11/2011 Document Reviewed: 03/02/2007 [facility] Patient Information 2014 [place], [place].

## 2022-08-07 NOTE — Progress Notes (Signed)
COVID Vaccine received:  _0  No _1  Yes Date of any COVID positive Test in last 90 days: none  PCP - Deneise Lever, DO  [address]  [address]  [place], [place] [address]  Phone: [phone]  Fax: [phone]  Cardiologist - none  Chest x-ray -  EKG -  will get at PST Stress Test -  ECHO -  Cardiac Cath -   PCR screen: _2  Ordered & Completed                      _3   No Order but Needs PROFEND                      _4   N/A for this surgery  Surgery Plan:  _5  Ambulatory                            _6  Outpatient in bed                            _7  Admit  Anesthesia:    _8  General  _9  Spinal                           _10   Choice _11   MAC  Pacemaker / ICD device _12  No _13  Yes        Device order form faxed _14  No    _15   Yes      Faxed to:  Spinal Cord Stimulator:_16  No _17  Yes      (Remind patient to bring remote DOS) Other Implants:   History of Sleep Apnea? _18  No _19  Yes   CPAP used?- _20  No _21  Yes    Does the patient monitor blood sugar? 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   Activity level: Patient can climb a flight of stairs without difficulty; _29  No CP  _30  No SOB, but would have leg pain.  Patient can perform ADLs without assistance.   Anesthesia review: Anxiety, Asthma, Vertigo after Knee surgery, HTN,  Patient denies shortness of breath, fever, cough and chest pain at PAT appointment.  Patient verbalized understanding and agreement to the Pre-Surgical Instructions that were given to them at this PAT appointment. Patient was also educated of the need to review these PAT instructions again prior to his/her surgery.I reviewed the appropriate  phone numbers to call if they have any and questions or concerns.

## 2022-08-08 ENCOUNTER — Encounter (HOSPITAL_COMMUNITY)
Admission: RE | Admit: 2022-08-08 | Discharge: 2022-08-08 | Disposition: A | Discharge status: Home / Self Care | Payer: Medicare HMO | Source: Ambulatory Visit | Attending: Orthopedic Surgery | Admitting: Orthopedic Surgery

## 2022-08-08 ENCOUNTER — Other Ambulatory Visit: Payer: Self-pay

## 2022-08-08 ENCOUNTER — Encounter (HOSPITAL_COMMUNITY): Payer: Self-pay

## 2022-08-08 VITALS — BP 162/84 | HR 82 | Temp 98.7°F | Resp 18 | Ht 72.0 in | Wt 209.0 lb

## 2022-08-08 DIAGNOSIS — Z01818 Encounter for other preprocedural examination: Secondary | ICD-10-CM | POA: Insufficient documentation

## 2022-08-08 DIAGNOSIS — I10 Essential (primary) hypertension: Secondary | ICD-10-CM | POA: Diagnosis not present

## 2022-08-08 HISTORY — DX: Essential (primary) hypertension: I10

## 2022-08-08 HISTORY — DX: Personal history of urinary calculi: Z87.442

## 2022-08-08 LAB — SURGICAL PCR SCREEN
MRSA, PCR: NEGATIVE
Staphylococcus aureus: NEGATIVE

## 2022-08-08 LAB — BASIC METABOLIC PANEL
Anion gap: 8 (ref 5–15)
BUN: 20 mg/dL (ref 8–23)
CO2: 26 mmol/L (ref 22–32)
Calcium: 9.6 mg/dL (ref 8.9–10.3)
Chloride: 104 mmol/L (ref 98–111)
Creatinine, Ser: 1.06 mg/dL (ref 0.61–1.24)
GFR, Estimated: >60 mL/min (ref >60)
Glucose, Bld: 101 mg/dL — ABNORMAL HIGH (ref 70–99)
Potassium: 4.6 mmol/L (ref 3.5–5.1)
Sodium: 138 mmol/L (ref 135–145)

## 2022-08-08 LAB — CBC
HCT: 49.3 % (ref 39.0–52.0)
Hemoglobin: 16 g/dL (ref 13.0–17.0)
MCH: 29.6 pg (ref 26.0–34.0)
MCHC: 32.5 g/dL (ref 30.0–36.0)
MCV: 91.3 fL (ref 80.0–100.0)
Platelets: 388 10*3/uL (ref 150–400)
RBC: 5.4 MIL/uL (ref 4.22–5.81)
RDW: 13.3 % (ref 11.5–15.5)
WBC: 10.3 10*3/uL (ref 4.0–10.5)
nRBC: 0 % (ref 0.0–0.2)

## 2022-08-18 NOTE — Anesthesia Preprocedure Evaluation (Addendum)
Anesthesia Evaluation  Patient identified by MRN, date of birth, ID band Patient awake    Reviewed: Allergy & Precautions, NPO status , Patient's Chart, lab work & pertinent test results  History of Anesthesia Complications (+) history of anesthetic complications  Airway Mallampati: I  TM Distance: >3 FB Neck ROM: Full    Dental no notable dental hx. (+) Teeth Intact, Dental Advisory Given   Pulmonary asthma (inhaler 2x a day)    Pulmonary exam normal breath sounds clear to auscultation       Cardiovascular hypertension, Pt. on medications Normal cardiovascular exam Rhythm:Regular Rate:Normal     Neuro/Psych   Anxiety     Closed head injury - patient fell off ladder. Patient reports some short term memory loss as a result of injury.    GI/Hepatic Neg liver ROS,GERD  ,,  Endo/Other    Renal/GU negative Renal ROSLab Results      Component                Value               Date                      CREATININE               1.06                08/08/2022                 K                        4.6                 08/08/2022                   Musculoskeletal  (+) Arthritis ,    Abdominal   Peds  Hematology Lab Results      Component                Value               Date                      WBC                      10.3                08/08/2022                HGB                      16.0                08/08/2022                HCT                      49.3                08/08/2022                MCV                      91.3                08/08/2022  PLT                      388                 08/08/2022              Anesthesia Other Findings All: Valproic acid  Reproductive/Obstetrics                              Anesthesia Physical Anesthesia Plan  ASA: 3  Anesthesia Plan: Spinal and Regional   Post-op Pain Management: Regional block* and Minimal or no pain  anticipated   Induction: Intravenous  PONV Risk Score and Plan: 2 and Treatment may vary due to age or medical condition, Midazolam and Ondansetron  Airway Management Planned: Nasal Cannula and Natural Airway  Additional Equipment: None  Intra-op Plan:   Post-operative Plan:   Informed Consent: I have reviewed the patients History and Physical, chart, labs and discussed the procedure including the risks, benefits and alternatives for the proposed anesthesia with the patient or authorized representative who has indicated his/her understanding and acceptance.     Dental advisory given  Plan Discussed with: CRNA, Anesthesiologist and Surgeon  Anesthesia Plan Comments: (Spinal w R adductor canal)        Anesthesia Quick Evaluation

## 2022-08-19 ENCOUNTER — Ambulatory Visit (HOSPITAL_COMMUNITY): Payer: Medicare HMO | Admitting: Physician Assistant

## 2022-08-19 ENCOUNTER — Other Ambulatory Visit: Payer: Self-pay

## 2022-08-19 ENCOUNTER — Ambulatory Visit (HOSPITAL_BASED_OUTPATIENT_CLINIC_OR_DEPARTMENT_OTHER): Payer: Medicare HMO | Admitting: Anesthesiology

## 2022-08-19 ENCOUNTER — Encounter (HOSPITAL_COMMUNITY)
Admission: RE | Disposition: A | Discharge status: Home / Self Care | Payer: Self-pay | Source: Home / Self Care | Attending: Orthopedic Surgery

## 2022-08-19 ENCOUNTER — Encounter (HOSPITAL_COMMUNITY): Payer: Self-pay | Admitting: Orthopedic Surgery

## 2022-08-19 ENCOUNTER — Observation Stay (HOSPITAL_COMMUNITY)
Admission: RE | Admit: 2022-08-19 | Discharge: 2022-08-21 | Disposition: A | Discharge status: Home / Self Care | Payer: Medicare HMO | Attending: Orthopedic Surgery | Admitting: Orthopedic Surgery

## 2022-08-19 DIAGNOSIS — I10 Essential (primary) hypertension: Secondary | ICD-10-CM | POA: Insufficient documentation

## 2022-08-19 DIAGNOSIS — F419 Anxiety disorder, unspecified: Secondary | ICD-10-CM | POA: Diagnosis not present

## 2022-08-19 DIAGNOSIS — J45909 Unspecified asthma, uncomplicated: Secondary | ICD-10-CM | POA: Insufficient documentation

## 2022-08-19 DIAGNOSIS — Z7982 Long term (current) use of aspirin: Secondary | ICD-10-CM | POA: Insufficient documentation

## 2022-08-19 DIAGNOSIS — M1711 Unilateral primary osteoarthritis, right knee: Secondary | ICD-10-CM

## 2022-08-19 DIAGNOSIS — Z79899 Other long term (current) drug therapy: Secondary | ICD-10-CM | POA: Insufficient documentation

## 2022-08-19 DIAGNOSIS — Z96652 Presence of left artificial knee joint: Secondary | ICD-10-CM | POA: Insufficient documentation

## 2022-08-19 HISTORY — PX: TOTAL KNEE ARTHROPLASTY: SHX125

## 2022-08-19 SURGERY — ARTHROPLASTY, KNEE, TOTAL
Anesthesia: Regional | Site: Knee | Laterality: Right

## 2022-08-19 MED ORDER — MIDAZOLAM HCL 2 MG/2ML IJ SOLN
INTRAMUSCULAR | Status: AC
  Administered 2022-08-19: 2 mg via INTRAVENOUS
  Filled 2022-08-19: qty 2

## 2022-08-19 MED ORDER — ACETAMINOPHEN 10 MG/ML IV SOLN: 1000.0000 mg | Freq: Once | INTRAVENOUS | Status: DC | PRN

## 2022-08-19 MED ORDER — POVIDONE-IODINE 10 % EX SWAB
2.0000 | Freq: Once | CUTANEOUS | Status: AC
  Administered 2022-08-19: 2 via TOPICAL

## 2022-08-19 MED ORDER — SODIUM CHLORIDE (PF) 0.9 % IJ SOLN
INTRAMUSCULAR | Status: AC
  Filled 2022-08-19: qty 10

## 2022-08-19 MED ORDER — ONDANSETRON HCL 4 MG/2ML IJ SOLN
INTRAMUSCULAR | Status: DC | PRN
  Administered 2022-08-19: 4 mg via INTRAVENOUS

## 2022-08-19 MED ORDER — MIDAZOLAM HCL 2 MG/2ML IJ SOLN: 1.0000 mg | INTRAMUSCULAR | Status: DC

## 2022-08-19 MED ORDER — METOCLOPRAMIDE HCL 5 MG PO TABS: 5.0000 mg | ORAL_TABLET | Freq: Three times a day (TID) | ORAL | Status: DC | PRN

## 2022-08-19 MED ORDER — LIDOCAINE HCL (PF) 2 % IJ SOLN
INTRAMUSCULAR | Status: AC
  Filled 2022-08-19: qty 5

## 2022-08-19 MED ORDER — ONDANSETRON HCL 4 MG/2ML IJ SOLN: 4.0000 mg | Freq: Once | INTRAMUSCULAR | Status: DC | PRN

## 2022-08-19 MED ORDER — PROPOFOL 10 MG/ML IV BOLUS
INTRAVENOUS | Status: AC
  Filled 2022-08-19: qty 20

## 2022-08-19 MED ORDER — PHENYLEPHRINE 80 MCG/ML (10ML) SYRINGE FOR IV PUSH (FOR BLOOD PRESSURE SUPPORT)
PREFILLED_SYRINGE | INTRAVENOUS | Status: DC | PRN
  Administered 2022-08-19: 160 ug via INTRAVENOUS
  Administered 2022-08-19: 240 ug via INTRAVENOUS
  Administered 2022-08-19: 80 ug via INTRAVENOUS
  Administered 2022-08-19 (×3): 160 ug via INTRAVENOUS
  Administered 2022-08-19 (×3): 80 ug via INTRAVENOUS
  Administered 2022-08-19: 160 ug via INTRAVENOUS

## 2022-08-19 MED ORDER — FENTANYL CITRATE PF 50 MCG/ML IJ SOSY: 50.0000 ug | PREFILLED_SYRINGE | INTRAMUSCULAR | Status: DC

## 2022-08-19 MED ORDER — PROPOFOL 500 MG/50ML IV EMUL
INTRAVENOUS | Status: DC | PRN
  Administered 2022-08-19: 125 ug/kg/min via INTRAVENOUS

## 2022-08-19 MED ORDER — DOCUSATE SODIUM 100 MG PO CAPS
100.0000 mg | ORAL_CAPSULE | Freq: Two times a day (BID) | ORAL | Status: DC
  Administered 2022-08-19 – 2022-08-21 (×4): 100 mg via ORAL
  Filled 2022-08-19 (×4): qty 1

## 2022-08-19 MED ORDER — SODIUM CHLORIDE 0.9 % IR SOLN
Status: DC | PRN
  Administered 2022-08-19: 1000 mL

## 2022-08-19 MED ORDER — DIPHENHYDRAMINE HCL 12.5 MG/5ML PO ELIX: 12.5000 mg | ORAL_SOLUTION | ORAL | Status: DC | PRN

## 2022-08-19 MED ORDER — SODIUM CHLORIDE 0.9 % IV SOLN: INTRAVENOUS | Status: DC

## 2022-08-19 MED ORDER — OXYCODONE HCL 5 MG PO TABS
5.0000 mg | ORAL_TABLET | ORAL | Status: DC | PRN
  Administered 2022-08-19 (×2): 10 mg via ORAL
  Administered 2022-08-19 – 2022-08-20 (×2): 5 mg via ORAL
  Administered 2022-08-20: 10 mg via ORAL
  Filled 2022-08-19 (×3): qty 2
  Filled 2022-08-19: qty 1

## 2022-08-19 MED ORDER — OXYCODONE HCL 5 MG PO TABS: 5.0000 mg | ORAL_TABLET | Freq: Once | ORAL | Status: DC | PRN

## 2022-08-19 MED ORDER — PROPOFOL 1000 MG/100ML IV EMUL
INTRAVENOUS | Status: AC
  Filled 2022-08-19: qty 100

## 2022-08-19 MED ORDER — ALBUTEROL SULFATE HFA 108 (90 BASE) MCG/ACT IN AERS: 2.0000 | INHALATION_SPRAY | Freq: Four times a day (QID) | RESPIRATORY_TRACT | Status: DC | PRN

## 2022-08-19 MED ORDER — BUPIVACAINE LIPOSOME 1.3 % IJ SUSP: 20.0000 mL | Freq: Once | INTRAMUSCULAR | Status: DC

## 2022-08-19 MED ORDER — CHLORHEXIDINE GLUCONATE 0.12 % MT SOLN
15.0000 mL | Freq: Once | OROMUCOSAL | Status: AC
  Administered 2022-08-19: 15 mL via OROMUCOSAL

## 2022-08-19 MED ORDER — BUPIVACAINE LIPOSOME 1.3 % IJ SUSP
INTRAMUSCULAR | Status: DC | PRN
  Administered 2022-08-19: 20 mL

## 2022-08-19 MED ORDER — FLEET ENEMA 7-19 GM/118ML RE ENEM: 1.0000 | ENEMA | Freq: Once | RECTAL | Status: DC | PRN

## 2022-08-19 MED ORDER — HYDROMORPHONE HCL 1 MG/ML IJ SOLN: 0.2500 mg | INTRAMUSCULAR | Status: DC | PRN

## 2022-08-19 MED ORDER — FENTANYL CITRATE (PF) 100 MCG/2ML IJ SOLN
INTRAMUSCULAR | Status: AC
  Filled 2022-08-19: qty 2

## 2022-08-19 MED ORDER — LORATADINE 10 MG PO TABS
10.0000 mg | ORAL_TABLET | Freq: Every day | ORAL | Status: DC
  Administered 2022-08-20 – 2022-08-21 (×2): 10 mg via ORAL
  Filled 2022-08-19 (×2): qty 1

## 2022-08-19 MED ORDER — ASPIRIN 325 MG PO TBEC
325.0000 mg | DELAYED_RELEASE_TABLET | Freq: Two times a day (BID) | ORAL | Status: DC
  Administered 2022-08-20 – 2022-08-21 (×3): 325 mg via ORAL
  Filled 2022-08-19 (×3): qty 1

## 2022-08-19 MED ORDER — OXYCODONE HCL 5 MG/5ML PO SOLN: 5.0000 mg | Freq: Once | ORAL | Status: DC | PRN

## 2022-08-19 MED ORDER — PHENOL 1.4 % MT LIQD: 1.0000 | OROMUCOSAL | Status: DC | PRN

## 2022-08-19 MED ORDER — BUPIVACAINE LIPOSOME 1.3 % IJ SUSP
INTRAMUSCULAR | Status: AC
  Filled 2022-08-19: qty 20

## 2022-08-19 MED ORDER — METHOCARBAMOL 1000 MG/10ML IJ SOLN
500.0000 mg | Freq: Four times a day (QID) | INTRAVENOUS | Status: DC | PRN
  Filled 2022-08-19: qty 5

## 2022-08-19 MED ORDER — 0.9 % SODIUM CHLORIDE (POUR BTL) OPTIME
TOPICAL | Status: DC | PRN
  Administered 2022-08-19: 1000 mL

## 2022-08-19 MED ORDER — MENTHOL 3 MG MT LOZG: 1.0000 | LOZENGE | OROMUCOSAL | Status: DC | PRN

## 2022-08-19 MED ORDER — BISACODYL 10 MG RE SUPP: 10.0000 mg | Freq: Every day | RECTAL | Status: DC | PRN

## 2022-08-19 MED ORDER — METOCLOPRAMIDE HCL 5 MG/ML IJ SOLN
5.0000 mg | Freq: Three times a day (TID) | INTRAMUSCULAR | Status: DC | PRN
  Administered 2022-08-20: 5 mg via INTRAVENOUS
  Filled 2022-08-19: qty 2

## 2022-08-19 MED ORDER — SODIUM CHLORIDE (PF) 0.9 % IJ SOLN
INTRAMUSCULAR | Status: DC | PRN
  Administered 2022-08-19: 60 mL

## 2022-08-19 MED ORDER — PHENYLEPHRINE 80 MCG/ML (10ML) SYRINGE FOR IV PUSH (FOR BLOOD PRESSURE SUPPORT)
PREFILLED_SYRINGE | INTRAVENOUS | Status: AC
  Filled 2022-08-19: qty 10

## 2022-08-19 MED ORDER — GABAPENTIN 300 MG PO CAPS
600.0000 mg | ORAL_CAPSULE | Freq: Two times a day (BID) | ORAL | Status: DC
  Administered 2022-08-19 – 2022-08-21 (×4): 600 mg via ORAL
  Filled 2022-08-19 (×4): qty 2

## 2022-08-19 MED ORDER — ACETAMINOPHEN 10 MG/ML IV SOLN
1000.0000 mg | Freq: Four times a day (QID) | INTRAVENOUS | Status: DC
  Filled 2022-08-19: qty 100

## 2022-08-19 MED ORDER — ONDANSETRON 4 MG PO TBDP
4.0000 mg | ORAL_TABLET | Freq: Four times a day (QID) | ORAL | Status: DC | PRN
  Administered 2022-08-20: 4 mg via ORAL
  Filled 2022-08-19: qty 1

## 2022-08-19 MED ORDER — SODIUM CHLORIDE (PF) 0.9 % IJ SOLN
INTRAMUSCULAR | Status: AC
  Filled 2022-08-19: qty 50

## 2022-08-19 MED ORDER — CEFAZOLIN SODIUM-DEXTROSE 2-4 GM/100ML-% IV SOLN
2.0000 g | Freq: Four times a day (QID) | INTRAVENOUS | Status: AC
  Administered 2022-08-19 (×2): 2 g via INTRAVENOUS
  Filled 2022-08-19 (×2): qty 100

## 2022-08-19 MED ORDER — ORAL CARE MOUTH RINSE: 15.0000 mL | Freq: Once | OROMUCOSAL | Status: AC

## 2022-08-19 MED ORDER — DEXAMETHASONE SODIUM PHOSPHATE 10 MG/ML IJ SOLN
10.0000 mg | Freq: Once | INTRAMUSCULAR | Status: AC
  Administered 2022-08-20: 10 mg via INTRAVENOUS
  Filled 2022-08-19: qty 1

## 2022-08-19 MED ORDER — CLONIDINE HCL (ANALGESIA) 100 MCG/ML EP SOLN
EPIDURAL | Status: DC | PRN
  Administered 2022-08-19: 100 ug

## 2022-08-19 MED ORDER — MOMETASONE FURO-FORMOTEROL FUM 100-5 MCG/ACT IN AERO
2.0000 | INHALATION_SPRAY | Freq: Two times a day (BID) | RESPIRATORY_TRACT | Status: DC
  Administered 2022-08-19 – 2022-08-21 (×4): 2 via RESPIRATORY_TRACT
  Filled 2022-08-19: qty 8.8

## 2022-08-19 MED ORDER — OXYCODONE HCL 5 MG PO TABS
10.0000 mg | ORAL_TABLET | ORAL | Status: DC | PRN
  Filled 2022-08-19: qty 2

## 2022-08-19 MED ORDER — AMISULPRIDE (ANTIEMETIC) 5 MG/2ML IV SOLN: 10.0000 mg | Freq: Once | INTRAVENOUS | Status: DC | PRN

## 2022-08-19 MED ORDER — LACTATED RINGERS IV SOLN: INTRAVENOUS | Status: DC

## 2022-08-19 MED ORDER — METHOCARBAMOL 500 MG PO TABS
500.0000 mg | ORAL_TABLET | Freq: Four times a day (QID) | ORAL | Status: DC | PRN
  Administered 2022-08-19 – 2022-08-21 (×6): 500 mg via ORAL
  Filled 2022-08-19 (×7): qty 1

## 2022-08-19 MED ORDER — LIDOCAINE 2% (20 MG/ML) 5 ML SYRINGE
INTRAMUSCULAR | Status: DC | PRN
  Administered 2022-08-19: 40 mg via INTRAVENOUS

## 2022-08-19 MED ORDER — ROPIVACAINE HCL 5 MG/ML IJ SOLN
INTRAMUSCULAR | Status: DC | PRN
  Administered 2022-08-19: 30 mL via PERINEURAL

## 2022-08-19 MED ORDER — PROPOFOL 10 MG/ML IV BOLUS
INTRAVENOUS | Status: DC | PRN
  Administered 2022-08-19 (×2): 20 mg via INTRAVENOUS

## 2022-08-19 MED ORDER — MECLIZINE HCL 25 MG PO TABS
25.0000 mg | ORAL_TABLET | Freq: Four times a day (QID) | ORAL | Status: DC | PRN
  Administered 2022-08-20: 25 mg via ORAL
  Filled 2022-08-19: qty 1

## 2022-08-19 MED ORDER — FENTANYL CITRATE PF 50 MCG/ML IJ SOSY
PREFILLED_SYRINGE | INTRAMUSCULAR | Status: AC
  Administered 2022-08-19: 100 ug via INTRAVENOUS
  Filled 2022-08-19: qty 2

## 2022-08-19 MED ORDER — POLYETHYLENE GLYCOL 3350 17 G PO PACK
17.0000 g | PACK | Freq: Every day | ORAL | Status: DC | PRN
  Administered 2022-08-20: 17 g via ORAL
  Filled 2022-08-19: qty 1

## 2022-08-19 MED ORDER — ONDANSETRON HCL 4 MG/2ML IJ SOLN: 4.0000 mg | Freq: Four times a day (QID) | INTRAMUSCULAR | Status: DC | PRN

## 2022-08-19 MED ORDER — LORAZEPAM 0.5 MG PO TABS: 0.5000 mg | ORAL_TABLET | Freq: Every day | ORAL | Status: DC | PRN

## 2022-08-19 MED ORDER — CEFAZOLIN SODIUM-DEXTROSE 2-4 GM/100ML-% IV SOLN
2.0000 g | INTRAVENOUS | Status: DC
  Filled 2022-08-19: qty 100

## 2022-08-19 MED ORDER — TRANEXAMIC ACID-NACL 1000-0.7 MG/100ML-% IV SOLN
1000.0000 mg | INTRAVENOUS | Status: DC
  Filled 2022-08-19: qty 100

## 2022-08-19 MED ORDER — DEXAMETHASONE SODIUM PHOSPHATE 10 MG/ML IJ SOLN
INTRAMUSCULAR | Status: AC
  Filled 2022-08-19: qty 1

## 2022-08-19 MED ORDER — ONDANSETRON HCL 4 MG/2ML IJ SOLN
INTRAMUSCULAR | Status: AC
  Filled 2022-08-19: qty 2

## 2022-08-19 MED ORDER — ACETAMINOPHEN 500 MG PO TABS
1000.0000 mg | ORAL_TABLET | Freq: Four times a day (QID) | ORAL | Status: AC
  Administered 2022-08-19 – 2022-08-20 (×4): 1000 mg via ORAL
  Filled 2022-08-19 (×4): qty 2

## 2022-08-19 MED ORDER — DEXAMETHASONE SODIUM PHOSPHATE 10 MG/ML IJ SOLN
8.0000 mg | Freq: Once | INTRAMUSCULAR | Status: AC
  Administered 2022-08-19: 8 mg via INTRAVENOUS

## 2022-08-19 SURGICAL SUPPLY — 54 items
ATTUNE MED DOME PAT 41 KNEE (Knees) IMPLANT
ATTUNE PS FEM RT SZ 8 CEM KNEE (Femur) IMPLANT
ATTUNE PSRP INSR SZ8 10 KNEE (Insert) IMPLANT
BAG COUNTER SPONGE SURGICOUNT (BAG) IMPLANT
BAG ZIPLOCK 12X15 (MISCELLANEOUS) ×1 IMPLANT
BASE TIBIAL ROT PLAT SZ 8 KNEE (Knees) IMPLANT
BLADE SAG 18X100X1.27 (BLADE) ×1 IMPLANT
BLADE SAW SGTL 11.0X1.19X90.0M (BLADE) ×1 IMPLANT
BNDG ELASTIC 6X5.8 VLCR STR LF (GAUZE/BANDAGES/DRESSINGS) ×1 IMPLANT
BOWL SMART MIX CTS (DISPOSABLE) ×1 IMPLANT
CEMENT HV SMART SET (Cement) ×2 IMPLANT
CLSR STERI-STRIP ANTIMIC 1/2X4 (GAUZE/BANDAGES/DRESSINGS) IMPLANT
COVER SURGICAL LIGHT HANDLE (MISCELLANEOUS) ×1 IMPLANT
CUFF TOURN SGL QUICK 34 (TOURNIQUET CUFF) ×1
CUFF TRNQT CYL 34X4.125X (TOURNIQUET CUFF) ×1 IMPLANT
DRAPE INCISE IOBAN 66X45 STRL (DRAPES) ×1 IMPLANT
DRAPE U-SHAPE 47X51 STRL (DRAPES) ×1 IMPLANT
DRSG AQUACEL AG ADV 3.5X10 (GAUZE/BANDAGES/DRESSINGS) ×1 IMPLANT
DURAPREP 26ML APPLICATOR (WOUND CARE) ×1 IMPLANT
ELECT REM PT RETURN 15FT ADLT (MISCELLANEOUS) ×1 IMPLANT
GLOVE BIO SURGEON STRL SZ 6.5 (GLOVE) IMPLANT
GLOVE BIO SURGEON STRL SZ7.5 (GLOVE) IMPLANT
GLOVE BIO SURGEON STRL SZ8 (GLOVE) ×1 IMPLANT
GLOVE BIOGEL PI IND STRL 6.5 (GLOVE) IMPLANT
GLOVE BIOGEL PI IND STRL 7.0 (GLOVE) IMPLANT
GLOVE BIOGEL PI IND STRL 8 (GLOVE) ×1 IMPLANT
GOWN STRL REUS W/ TWL LRG LVL3 (GOWN DISPOSABLE) ×1 IMPLANT
GOWN STRL REUS W/ TWL XL LVL3 (GOWN DISPOSABLE) IMPLANT
GOWN STRL REUS W/TWL LRG LVL3 (GOWN DISPOSABLE) ×1
GOWN STRL REUS W/TWL XL LVL3 (GOWN DISPOSABLE)
HANDPIECE INTERPULSE COAX TIP (DISPOSABLE) ×1
HOLDER FOLEY CATH W/STRAP (MISCELLANEOUS) IMPLANT
IMMOBILIZER KNEE 20 (SOFTGOODS) ×1
IMMOBILIZER KNEE 20 THIGH 36 (SOFTGOODS) ×1 IMPLANT
KIT TURNOVER KIT A (KITS) IMPLANT
MANIFOLD NEPTUNE II (INSTRUMENTS) ×1 IMPLANT
NS IRRIG 1000ML POUR BTL (IV SOLUTION) ×1 IMPLANT
PACK TOTAL KNEE CUSTOM (KITS) ×1 IMPLANT
PADDING CAST COTTON 6X4 STRL (CAST SUPPLIES) ×2 IMPLANT
PIN STEINMAN FIXATION KNEE (PIN) IMPLANT
PROTECTOR NERVE ULNAR (MISCELLANEOUS) ×1 IMPLANT
SET HNDPC FAN SPRY TIP SCT (DISPOSABLE) ×1 IMPLANT
SPIKE FLUID TRANSFER (MISCELLANEOUS) ×1 IMPLANT
STRIP CLOSURE SKIN 1/2X4 (GAUZE/BANDAGES/DRESSINGS) ×2 IMPLANT
SUT MNCRL AB 4-0 PS2 18 (SUTURE) ×1 IMPLANT
SUT STRATAFIX 0 PDS 27 VIOLET (SUTURE) ×1
SUT VIC AB 2-0 CT1 27 (SUTURE) ×3
SUT VIC AB 2-0 CT1 TAPERPNT 27 (SUTURE) ×3 IMPLANT
SUTURE STRATFX 0 PDS 27 VIOLET (SUTURE) ×1 IMPLANT
TIBIAL BASE ROT PLAT SZ 8 KNEE (Knees) ×1 IMPLANT
TRAY FOLEY MTR SLVR 16FR STAT (SET/KITS/TRAYS/PACK) ×1 IMPLANT
TUBE SUCTION HIGH CAP CLEAR NV (SUCTIONS) ×1 IMPLANT
WATER STERILE IRR 1000ML POUR (IV SOLUTION) ×2 IMPLANT
WRAP KNEE MAXI GEL POST OP (GAUZE/BANDAGES/DRESSINGS) ×1 IMPLANT

## 2022-08-19 NOTE — Interval H&P Note (Signed)
History and Physical Interval Note:  08/19/2022 6:27 AM  Howard Morton  has presented today for surgery, with the diagnosis of right knee osteoarthritis.  The various methods of treatment have been discussed with the patient and family. After consideration of risks, benefits and other options for treatment, the patient has consented to  Procedure(s): TOTAL KNEE ARTHROPLASTY (Right) as a surgical intervention.  The patient's history has been reviewed, patient examined, no change in status, stable for surgery.  I have reviewed the patient's chart and labs.  Questions were answered to the patient's satisfaction.     Homero Fellers Samara Stankowski

## 2022-08-19 NOTE — Anesthesia Procedure Notes (Signed)
Procedure Name: MAC Date/Time: 08/19/2022 8:30 AM  Performed by: Jenne Campus, CRNAPre-anesthesia Checklist: Patient identified, Emergency Drugs available, Suction available, Patient being monitored and Timeout performed Oxygen Delivery Method: Simple face mask Placement Confirmation: positive ETCO2

## 2022-08-19 NOTE — Anesthesia Procedure Notes (Signed)
Spinal  Patient location during procedure: OR Start time: 08/19/2022 8:13 AM End time: 08/19/2022 8:16 AM Reason for block: surgical anesthesia Staffing Performed: anesthesiologist  Anesthesiologist: Trevor Iha, MD Performed by: Trevor Iha, MD Authorized by: Trevor Iha, MD   Preanesthetic Checklist Completed: patient identified, IV checked, risks and benefits discussed, surgical consent, monitors and equipment checked, pre-op evaluation and timeout performed Spinal Block Patient position: sitting Prep: DuraPrep and site prepped and draped Patient monitoring: heart rate, cardiac monitor, continuous pulse ox and blood pressure Approach: midline Location: L3-4 Injection technique: single-shot Needle Needle type: Pencan  Needle gauge: 24 G Needle length: 10 cm Needle insertion depth: 5 cm Assessment Sensory level: T4 Events: CSF return Additional Notes  1 Attempt (s). Pt tolerated procedure well.

## 2022-08-19 NOTE — Op Note (Signed)
OPERATIVE REPORT-TOTAL KNEE ARTHROPLASTY   Pre-operative diagnosis- Osteoarthritis  Right knee(s)  Post-operative diagnosis- Osteoarthritis Right knee(s)  Procedure-  Right  Total Knee Arthroplasty  Surgeon- Gus Rankin. Sharni Negron, MD  Assistant- Arcola Jansky, PA-C   Anesthesia-   Adductor canal block and spinal  EBL-50 mL   Drains None  Tourniquet time-  Total Tourniquet Time Documented: Thigh (Right) - 40 minutes Total: Thigh (Right) - 40 minutes     Complications- None  Condition-PACU - hemodynamically stable.   Brief Clinical Note  Howard Morton is a 66 y.o. year old male with end stage OA of his right knee with progressively worsening pain and dysfunction. He has constant pain, with activity and at rest and significant functional deficits with difficulties even with ADLs. He has had extensive non-op management including analgesics, injections of cortisone and viscosupplements, and home exercise program, but remains in significant pain with significant dysfunction. Radiographs show bone on bone arthritis medial and patellofemoral. He presents now for right Total Knee Arthroplasty.     Procedure in detail---   The patient is brought into the operating room and positioned supine on the operating table. After successful administration of  Adductor canal block and spinal,   a tourniquet is placed high on the  Right thigh(s) and the lower extremity is prepped and draped in the usual sterile fashion. Time out is performed by the operating team and then the  Right lower extremity is wrapped in Esmarch, knee flexed and the tourniquet inflated to 300 mmHg.       A midline incision is made with a ten blade through the subcutaneous tissue to the level of the extensor mechanism. A fresh blade is used to make a medial parapatellar arthrotomy. Soft tissue over the proximal medial tibia is subperiosteally elevated to the joint line with a knife and into the semimembranosus bursa with a Cobb  elevator. Soft tissue over the proximal lateral tibia is elevated with attention being paid to avoiding the patellar tendon on the tibial tubercle. The patella is everted, knee flexed 90 degrees and the ACL and PCL are removed. Findings are bone on bone medial and patellofemoral with large global osteophytes        The drill is used to create a starting hole in the distal femur and the canal is thoroughly irrigated with sterile saline to remove the fatty contents. The 5 degree Right  valgus alignment guide is placed into the femoral canal and the distal femoral cutting block is pinned to remove 9 mm off the distal femur. Resection is made with an oscillating saw.      The tibia is subluxed forward and the menisci are removed. The extramedullary alignment guide is placed referencing proximally at the medial aspect of the tibial tubercle and distally along the second metatarsal axis and tibial crest. The block is pinned to remove 43mm off the more deficient medial  side. Resection is made with an oscillating saw. Size 8is the most appropriate size for the tibia and the proximal tibia is prepared with the modular drill and keel punch for that size.      The femoral sizing guide is placed and size 8 is most appropriate. Rotation is marked off the epicondylar axis and confirmed by creating a rectangular flexion gap at 90 degrees. The size 8 cutting block is pinned in this rotation and the anterior, posterior and chamfer cuts are made with the oscillating saw. The intercondylar block is then placed and that cut is made.  Trial size 8 tibial component, trial size 8 posterior stabilized femur and a 10  mm posterior stabilized rotating platform insert trial is placed. Full extension is achieved with excellent varus/valgus and anterior/posterior balance throughout full range of motion. The patella is everted and thickness measured to be 27  mm. Free hand resection is taken to 15 mm, a 41 template is placed, lug holes  are drilled, trial patella is placed, and it tracks normally. Osteophytes are removed off the posterior femur with the trial in place. All trials are removed and the cut bone surfaces prepared with pulsatile lavage. Cement is mixed and once ready for implantation, the size 8 tibial implant, size  8 posterior stabilized femoral component, and the size 41 patella are cemented in place and the patella is held with the clamp. The trial insert is placed and the knee held in full extension. The Exparel (20 ml mixed with 60 ml saline) is injected into the extensor mechanism, posterior capsule, medial and lateral gutters and subcutaneous tissues.  All extruded cement is removed and once the cement is hard the permanent 10 mm posterior stabilized rotating platform insert is placed into the tibial tray.      The wound is copiously irrigated with saline solution and the extensor mechanism closed with # 0 Stratofix suture. The tourniquet is released for a total tourniquet time of 40  minutes. Flexion against gravity is 140 degrees and the patella tracks normally. Subcutaneous tissue is closed with 2.0 vicryl and subcuticular with running 4.0 Monocryl. The incision is cleaned and dried and steri-strips and a bulky sterile dressing are applied. The limb is placed into a knee immobilizer and the patient is awakened and transported to recovery in stable condition.      Please note that a surgical assistant was a medical necessity for this procedure in order to perform it in a safe and expeditious manner. Surgical assistant was necessary to retract the ligaments and vital neurovascular structures to prevent injury to them and also necessary for proper positioning of the limb to allow for anatomic placement of the prosthesis.   Gus Rankin Satvik Parco, MD    08/19/2022, 9:26 AM

## 2022-08-19 NOTE — Progress Notes (Signed)
Orthopedic Tech Progress Note Patient Details:  Howard Morton 03/20/1956 [id number]  CPM Right Knee CPM Right Knee: On Right Knee Flexion (Degrees): 40 Right Knee Extension (Degrees): 10  Post Interventions Patient Tolerated: Well  Darleen Crocker 08/19/2022, 10:04 AM

## 2022-08-19 NOTE — Evaluation (Signed)
Physical Therapy Evaluation Patient Details Name: Howard Morton MRN: [id number] DOB: 10/21/1955 Today's Date: 08/19/2022  History of Present Illness  66 yo male s/p R TKA on 08/19/22. PMH: L TKA 08/07/20, closed reduction L knee 09/2020, OA, BPPV, asthma, anxiety  Clinical Impression  Pt is s/p TKA resulting in the deficits listed below (see PT Problem List).  Pt doing very well, amb ~ 60' with RW and min/guard. Pt and family report he had some issues with dizziness after last surgery so may need 2 sessions of PT tomorrow, continue to monitor  Pt will benefit from skilled PT to increase their independence and safety with mobility to allow discharge to the venue listed below.         Recommendations for follow up therapy are one component of a multi-disciplinary discharge planning process, led by the attending physician.  Recommendations may be updated based on patient status, additional functional criteria and insurance authorization.  Follow Up Recommendations Follow physician's recommendations for discharge plan and follow up therapies      Assistance Recommended at Discharge Intermittent Supervision/Assistance  Patient can return home with the following  Help with stairs or ramp for entrance;Assistance with cooking/housework;Assist for transportation    Equipment Recommendations None recommended by PT  Recommendations for Other Services       Functional Status Assessment Patient has had a recent decline in their functional status and demonstrates the ability to make significant improvements in function in a reasonable and predictable amount of time.     Precautions / Restrictions Precautions Precautions: Fall;Knee Required Braces or Orthoses: Knee Immobilizer - Left Restrictions Weight Bearing Restrictions: No Other Position/Activity Restrictions: WBAT      Mobility  Bed Mobility Overal bed mobility: Needs Assistance Bed Mobility: Supine to Sit     Supine to sit: Min  guard     General bed mobility comments: for safety    Transfers Overall transfer level: Needs assistance Equipment used: Rolling walker (2 wheels) Transfers: Sit to/from Stand             General transfer comment: cues for hand placement and safety    Ambulation/Gait Ambulation/Gait assistance: Min guard Gait Distance (Feet): 80 Feet Assistive device: Rolling walker (2 wheels) Gait Pattern/deviations: Step-to pattern, Decreased stance time - right       General Gait Details: cues for sequence initially, good gait stability, no LOB, no knee buckling  Stairs            Wheelchair Mobility    Modified Rankin (Stroke Patients Only)       Balance                                             Pertinent Vitals/Pain Pain Assessment Pain Assessment: 0-10 Pain Score: 4  Pain Location: right knee Pain Descriptors / Indicators: Aching, Guarding, Grimacing Pain Intervention(s): Limited activity within patient's tolerance, Monitored during session, Premedicated before session, Repositioned, Ice applied    Home Living Family/patient expects to be discharged to:: Private residence Living Arrangements: Spouse/significant other Available Help at Discharge: Family Type of Home: House Home Access: Stairs to enter Entrance Stairs-Rails: Left     Home Layout: Multi-level;Able to live on main level with bedroom/bathroom;Full bath on main level Home Equipment: Rolling Walker (2 wheels);BSC/3in1;Crutches;Cane - single point Additional Comments: very active, works out at least 3x/wk    Prior Function Prior  Level of Function : Independent/Modified Independent                     Hand Dominance        Extremity/Trunk Assessment   Upper Extremity Assessment Upper Extremity Assessment: Overall WFL for tasks assessed    Lower Extremity Assessment Lower Extremity Assessment: RLE deficits/detail RLE Deficits / Details: ankle WFL, knee  extension  and hip flexion grossly 3/5; knee flexion ~ 10 to 65 degrees       Communication   Communication: No difficulties  Cognition Arousal/Alertness: Awake/alert Behavior During Therapy: WFL for tasks assessed/performed Overall Cognitive Status: Within Functional Limits for tasks assessed                                          General Comments      Exercises Total Joint Exercises Ankle Circles/Pumps: AROM, Both, 10 reps Quad Sets: AROM, Both, 5 reps Straight Leg Raises: AROM, 5 reps, Right   Assessment/Plan    PT Assessment Patient needs continued PT services  PT Problem List Decreased strength;Decreased range of motion;Decreased activity tolerance;Decreased balance;Decreased mobility;Pain;Decreased knowledge of precautions;Decreased knowledge of use of DME       PT Treatment Interventions DME instruction;Therapeutic exercise;Gait training;Functional mobility training;Therapeutic activities;Patient/family education;Stair training    PT Goals (Current goals can be found in the Care Plan section)  Acute Rehab PT Goals Patient Stated Goal: back to working out PT Goal Formulation: With patient Time For Goal Achievement: 08/26/22    Frequency 7X/week     Co-evaluation               AM-PAC PT "6 Clicks" Mobility  Outcome Measure Help needed turning from your back to your side while in a flat bed without using bedrails?: A Little Help needed moving from lying on your back to sitting on the side of a flat bed without using bedrails?: None Help needed moving to and from a bed to a chair (including a wheelchair)?: A Little Help needed standing up from a chair using your arms (e.g., wheelchair or bedside chair)?: A Little Help needed to walk in hospital room?: A Little Help needed climbing 3-5 steps with a railing? : A Little 6 Click Score: 19    End of Session Equipment Utilized During Treatment: Gait belt Activity Tolerance: Patient tolerated  treatment well Patient left: in chair;with call bell/phone within reach;with chair alarm set;with family/visitor present Nurse Communication: Mobility status PT Visit Diagnosis: Other abnormalities of gait and mobility (R26.89);Difficulty in walking, not elsewhere classified (R26.2)    Time: 0623-7628 PT Time Calculation (min) (ACUTE ONLY): 25 min   Charges:   PT Evaluation $PT Eval Low Complexity: 1 Low PT Treatments $Gait Training: 8-22 mins        Delice Bison, PT  Acute Rehab Dept [facility]) [phone]  [facility] Weekend Pager [facility] only)  [phone]  08/19/2022   [facility] 08/19/2022, 4:35 PM

## 2022-08-19 NOTE — Transfer of Care (Signed)
Immediate Anesthesia Transfer of Care Note  Patient: Howard Morton  Procedure(s) Performed: TOTAL KNEE ARTHROPLASTY (Right: Knee)  Patient Location: PACU  Anesthesia Type:MAC and Spinal  Level of Consciousness: oriented, sedated, and patient cooperative  Airway & Oxygen Therapy: Patient Spontanous Breathing and Patient connected to face mask oxygen  Post-op Assessment: Report given to RN and Post -op Vital signs reviewed and stable  Post vital signs: Reviewed  Last Vitals:  Vitals Value Taken Time  BP 133/63 08/19/22 0948  Temp    Pulse 67 08/19/22 0950  Resp 19 08/19/22 0950  SpO2 99 % 08/19/22 0950  Vitals shown include unvalidated device data.  Last Pain:  Vitals:   08/19/22 0652  TempSrc:   PainSc: 0-No pain         Complications: No notable events documented.

## 2022-08-19 NOTE — Discharge Instructions (Signed)
 Frank Aluisio, MD Total Joint Specialist [facility] [address] [place], [place] [address] [phone]  TOTAL KNEE REPLACEMENT POSTOPERATIVE DIRECTIONS    Knee Rehabilitation, Guidelines Following Surgery  Results after knee surgery are often greatly improved when you follow the exercise, range of motion and muscle strengthening exercises prescribed by your doctor. Safety measures are also important to protect the knee from further injury. If any of these exercises cause you to have increased pain or swelling in your knee joint, decrease the amount until you are comfortable again and slowly increase them. If you have problems or questions, call your caregiver or physical therapist for advice.   BLOOD CLOT PREVENTION Take a 325 mg Aspirin two times a day for three weeks following surgery. Then take an 81 mg Aspirin once a day for three weeks. Then discontinue Aspirin. You may resume your vitamins/supplements upon discharge from the hospital. Do not take any NSAIDs (Advil, Aleve, Ibuprofen, Meloxicam, etc.) until you have discontinued the 325 mg Aspirin.  HOME CARE INSTRUCTIONS  Remove items at home which could result in a fall. This includes throw rugs or furniture in walking pathways.  ICE to the affected knee as much as tolerated. Icing helps control swelling. If the swelling is well controlled you will be more comfortable and rehab easier. Continue to use ice on the knee for pain and swelling from surgery. You may notice swelling that will progress down to the foot and ankle. This is normal after surgery. Elevate the leg when you are not up walking on it.    Continue to use the breathing machine which will help keep your temperature down. It is common for your temperature to cycle up and down following surgery, especially at night when you are not up moving around and exerting yourself. The breathing machine keeps your lungs expanded and your temperature  down. Do not place pillow under the operative knee, focus on keeping the knee straight while resting  DIET You may resume your previous home diet once you are discharged from the hospital.  DRESSING / WOUND CARE / SHOWERING Keep your bulky bandage on for 2 days. On the third post-operative day you may remove the Ace bandage and gauze. There is a waterproof adhesive bandage on your skin which will stay in place until your first follow-up appointment. Once you remove this you will not need to place another bandage You may begin showering 3 days following surgery, but do not submerge the incision under water.  ACTIVITY For the first 5 days, the key is rest and control of pain and swelling Do your home exercises twice a day starting on post-operative day 3. On the days you go to physical therapy, just do the home exercises once that day. You should rest, ice and elevate the leg for 50 minutes out of every hour. Get up and walk/stretch for 10 minutes per hour. After 5 days you can increase your activity slowly as tolerated. Walk with your walker as instructed. Use the walker until you are comfortable transitioning to a cane. Walk with the cane in the opposite hand of the operative leg. You may discontinue the cane once you are comfortable and walking steadily. Avoid periods of inactivity such as sitting longer than an hour when not asleep. This helps prevent blood clots.  You may discontinue the knee immobilizer once you are able to perform a straight leg raise while lying down. You may resume a sexual relationship in one month   or when given the OK by your doctor.  You may return to work once you are cleared by your doctor.  Do not drive a car for 6 weeks or until released by your surgeon.  Do not drive while taking narcotics.  TED HOSE STOCKINGS Wear the elastic stockings on both legs for three weeks following surgery during the day. You may remove them at night for sleeping.  WEIGHT  BEARING Weight bearing as tolerated with assist device (walker, cane, etc) as directed, use it as long as suggested by your surgeon or therapist, typically at least 4-6 weeks.  POSTOPERATIVE CONSTIPATION PROTOCOL Constipation - defined medically as fewer than three stools per week and severe constipation as less than one stool per week.  One of the most common issues patients have following surgery is constipation.  Even if you have a regular bowel pattern at home, your normal regimen is likely to be disrupted due to multiple reasons following surgery.  Combination of anesthesia, postoperative narcotics, change in appetite and fluid intake all can affect your bowels.  In order to avoid complications following surgery, here are some recommendations in order to help you during your recovery period.  Colace (docusate) - Pick up an over-the-counter form of Colace or another stool softener and take twice a day as long as you are requiring postoperative pain medications.  Take with a full glass of water daily.  If you experience loose stools or diarrhea, hold the colace until you stool forms back up. If your symptoms do not get better within 1 week or if they get worse, check with your doctor. Dulcolax (bisacodyl) - Pick up over-the-counter and take as directed by the product packaging as needed to assist with the movement of your bowels.  Take with a full glass of water.  Use this product as needed if not relieved by Colace only.  MiraLax (polyethylene glycol) - Pick up over-the-counter to have on hand. MiraLax is a solution that will increase the amount of water in your bowels to assist with bowel movements.  Take as directed and can mix with a glass of water, juice, soda, coffee, or tea. Take if you go more than two days without a movement. Do not use MiraLax more than once per day. Call your doctor if you are still constipated or irregular after using this medication for 7 days in a row.  If you continue  to have problems with postoperative constipation, please contact the office for further assistance and recommendations.  If you experience "the worst abdominal pain ever" or develop nausea or vomiting, please contact the office immediatly for further recommendations for treatment.  ITCHING If you experience itching with your medications, try taking only a single pain pill, or even half a pain pill at a time.  You can also use Benadryl over the counter for itching or also to help with sleep.   MEDICATIONS See your medication summary on the "After Visit Summary" that the nursing staff will review with you prior to discharge.  You may have some home medications which will be placed on hold until you complete the course of blood thinner medication.  It is important for you to complete the blood thinner medication as prescribed by your surgeon.  Continue your approved medications as instructed at time of discharge.  PRECAUTIONS If you experience chest pain or shortness of breath - call 911 immediately for transfer to the hospital emergency department.  If you develop a fever greater that 101 F,   purulent drainage from wound, increased redness or drainage from wound, foul odor from the wound/dressing, or calf pain - CONTACT YOUR SURGEON.                                                   FOLLOW-UP APPOINTMENTS Make sure you keep all of your appointments after your operation with your surgeon and caregivers. You should call the office at the above phone number and make an appointment for approximately two weeks after the date of your surgery or on the date instructed by your surgeon outlined in the "After Visit Summary".  RANGE OF MOTION AND STRENGTHENING EXERCISES  Rehabilitation of the knee is important following a knee injury or an operation. After just a few days of immobilization, the muscles of the thigh which control the knee become weakened and shrink (atrophy). Knee exercises are designed to build up  the tone and strength of the thigh muscles and to improve knee motion. Often times heat used for twenty to thirty minutes before working out will loosen up your tissues and help with improving the range of motion but do not use heat for the first two weeks following surgery. These exercises can be done on a training (exercise) mat, on the floor, on a table or on a bed. Use what ever works the best and is most comfortable for you Knee exercises include:  Leg Lifts - While your knee is still immobilized in a splint or cast, you can do straight leg raises. Lift the leg to 60 degrees, hold for 3 sec, and slowly lower the leg. Repeat 10-20 times 2-3 times daily. Perform this exercise against resistance later as your knee gets better.  Quad and Hamstring Sets - Tighten up the muscle on the front of the thigh (Quad) and hold for 5-10 sec. Repeat this 10-20 times hourly. Hamstring sets are done by pushing the foot backward against an object and holding for 5-10 sec. Repeat as with quad sets.  Leg Slides: Lying on your back, slowly slide your foot toward your buttocks, bending your knee up off the floor (only go as far as is comfortable). Then slowly slide your foot back down until your leg is flat on the floor again. Angel Wings: Lying on your back spread your legs to the side as far apart as you can without causing discomfort.  A rehabilitation program following serious knee injuries can speed recovery and prevent re-injury in the future due to weakened muscles. Contact your doctor or a physical therapist for more information on knee rehabilitation.   POST-OPERATIVE OPIOID TAPER INSTRUCTIONS: It is important to wean off of your opioid medication as soon as possible. If you do not need pain medication after your surgery it is ok to stop day one. Opioids include: Codeine, Hydrocodone(Norco, Vicodin), Oxycodone(Percocet, oxycontin) and hydromorphone amongst others.  Long term and even short term use of opiods can  cause: Increased pain response Dependence Constipation Depression Respiratory depression And more.  Withdrawal symptoms can include Flu like symptoms Nausea, vomiting And more Techniques to manage these symptoms Hydrate well Eat regular healthy meals Stay active Use relaxation techniques(deep breathing, meditating, yoga) Do Not substitute Alcohol to help with tapering If you have been on opioids for less than two weeks and do not have pain than it is ok to stop all together.  Plan   to wean off of opioids This plan should start within one week post op of your joint replacement. Maintain the same interval or time between taking each dose and first decrease the dose.  Cut the total daily intake of opioids by one tablet each day Next start to increase the time between doses. The last dose that should be eliminated is the evening dose.   IF YOU ARE TRANSFERRED TO A SKILLED REHAB FACILITY If the patient is transferred to a skilled rehab facility following release from the hospital, a list of the current medications will be sent to the facility for the patient to continue.  When discharged from the skilled rehab facility, please have the facility set up the patient's Home Health Physical Therapy prior to being released. Also, the skilled facility will be responsible for providing the patient with their medications at time of release from the facility to include their pain medication, the muscle relaxants, and their blood thinner medication. If the patient is still at the rehab facility at time of the two week follow up appointment, the skilled rehab facility will also need to assist the patient in arranging follow up appointment in our office and any transportation needs.  MAKE SURE YOU:  Understand these instructions.  Get help right away if you are not doing well or get worse.   DENTAL ANTIBIOTICS:  In most cases prophylactic antibiotics for Dental procdeures after total joint surgery are  not necessary.  Exceptions are as follows:  1. History of prior total joint infection  2. Severely immunocompromised (Organ Transplant, cancer chemotherapy, Rheumatoid biologic medications such as Humera)  3. Poorly controlled diabetes (A1C &gt; 8.0, blood glucose over 200)  If you have one of these conditions, contact your surgeon for an antibiotic prescription, prior to your dental procedure.    Pick up stool softner and laxative for home use following surgery while on pain medications. Do not submerge incision under water. Please use good hand washing techniques while changing dressing each day. May shower starting three days after surgery. Please use a clean towel to pat the incision dry following showers. Continue to use ice for pain and swelling after surgery. Do not use any lotions or creams on the incision until instructed by your surgeon.  

## 2022-08-19 NOTE — Anesthesia Procedure Notes (Signed)
Anesthesia Regional Block: Adductor canal block   Pre-Anesthetic Checklist: , timeout performed,  Correct Patient, Correct Site, Correct Laterality,  Correct Procedure, Correct Position, site marked,  Risks and benefits discussed,  Surgical consent,  Pre-op evaluation,  At surgeon's request and post-op pain management  Laterality: Lower and Right  Prep: chloraprep       Needles:  Injection technique: Single-shot  Needle Type: Echogenic Needle     Needle Length: 9cm  Needle Gauge: 22     Additional Needles:   Procedures:,,,, ultrasound used (permanent image in chart),,    Narrative:  Start time: 08/19/2022 7:03 AM End time: 08/19/2022 7:08 AM Injection made incrementally with aspirations every 5 mL.  Performed by: Personally  Anesthesiologist: Trevor Iha, MD  Additional Notes: Block assessed prior to surgery. Pt tolerated procedure well.

## 2022-08-19 NOTE — Anesthesia Postprocedure Evaluation (Signed)
Anesthesia Post Note  Patient: Howard Morton  Procedure(s) Performed: TOTAL KNEE ARTHROPLASTY (Right: Knee)     Patient location during evaluation: Nursing Unit Anesthesia Type: Regional and Spinal Level of consciousness: oriented and awake and alert Pain management: pain level controlled Vital Signs Assessment: post-procedure vital signs reviewed and stable Respiratory status: spontaneous breathing and respiratory function stable Cardiovascular status: blood pressure returned to baseline and stable Postop Assessment: no headache, no backache, no apparent nausea or vomiting and patient able to bend at knees Anesthetic complications: no  No notable events documented.  Last Vitals:  Vitals:   08/19/22 1100 08/19/22 1115  BP: 130/70 125/74  Pulse: (!) 58 64  Resp: 13 19  Temp:  36.5 C  SpO2: 93% 99%    Last Pain:  Vitals:   08/19/22 1142  TempSrc:   PainSc: 0-No pain                 Trevor Iha

## 2022-08-20 ENCOUNTER — Encounter (HOSPITAL_COMMUNITY): Payer: Self-pay | Admitting: Orthopedic Surgery

## 2022-08-20 DIAGNOSIS — M1711 Unilateral primary osteoarthritis, right knee: Secondary | ICD-10-CM | POA: Diagnosis not present

## 2022-08-20 LAB — CBC
HCT: 44.1 % (ref 39.0–52.0)
HCT: 45 % (ref 39.0–52.0)
Hemoglobin: 14.3 g/dL (ref 13.0–17.0)
Hemoglobin: 14.5 g/dL (ref 13.0–17.0)
MCH: 29.9 pg (ref 26.0–34.0)
MCH: 29.7 pg (ref 26.0–34.0)
MCHC: 32.4 g/dL (ref 30.0–36.0)
MCHC: 32.2 g/dL (ref 30.0–36.0)
MCV: 92.1 fL (ref 80.0–100.0)
MCV: 92.2 fL (ref 80.0–100.0)
Platelets: 352 10*3/uL (ref 150–400)
Platelets: 355 10*3/uL (ref 150–400)
RBC: 4.79 MIL/uL (ref 4.22–5.81)
RBC: 4.88 MIL/uL (ref 4.22–5.81)
RDW: 13.4 % (ref 11.5–15.5)
RDW: 13.6 % (ref 11.5–15.5)
WBC: 27.9 10*3/uL — ABNORMAL HIGH (ref 4.0–10.5)
WBC: 27 10*3/uL — ABNORMAL HIGH (ref 4.0–10.5)
nRBC: 0 % (ref 0.0–0.2)
nRBC: 0 % (ref 0.0–0.2)

## 2022-08-20 LAB — BASIC METABOLIC PANEL
Anion gap: 9 (ref 5–15)
BUN: 21 mg/dL (ref 8–23)
CO2: 25 mmol/L (ref 22–32)
Calcium: 9 mg/dL (ref 8.9–10.3)
Chloride: 104 mmol/L (ref 98–111)
Creatinine, Ser: 1.05 mg/dL (ref 0.61–1.24)
GFR, Estimated: >60 mL/min (ref >60)
Glucose, Bld: 132 mg/dL — ABNORMAL HIGH (ref 70–99)
Potassium: 4.8 mmol/L (ref 3.5–5.1)
Sodium: 138 mmol/L (ref 135–145)

## 2022-08-20 MED ORDER — OXYCODONE HCL 5 MG PO TABS: 5.0000 mg | ORAL_TABLET | Freq: Four times a day (QID) | ORAL | 0 refills | Status: DC | PRN

## 2022-08-20 MED ORDER — ASPIRIN 325 MG PO TBEC: 325.0000 mg | DELAYED_RELEASE_TABLET | Freq: Two times a day (BID) | ORAL | 0 refills | Status: AC

## 2022-08-20 MED ORDER — ONDANSETRON 4 MG PO TBDP: 4.0000 mg | ORAL_TABLET | Freq: Three times a day (TID) | ORAL | 0 refills | Status: AC | PRN

## 2022-08-20 MED ORDER — METHOCARBAMOL 500 MG PO TABS: 500.0000 mg | ORAL_TABLET | Freq: Four times a day (QID) | ORAL | 0 refills | Status: AC | PRN

## 2022-08-20 MED ORDER — TRAMADOL HCL 50 MG PO TABS
50.0000 mg | ORAL_TABLET | Freq: Four times a day (QID) | ORAL | Status: DC
  Filled 2022-08-20: qty 1

## 2022-08-20 MED ORDER — TRAMADOL HCL 50 MG PO TABS
50.0000 mg | ORAL_TABLET | Freq: Four times a day (QID) | ORAL | Status: DC | PRN
  Administered 2022-08-20: 100 mg via ORAL
  Administered 2022-08-20: 50 mg via ORAL
  Administered 2022-08-21: 100 mg via ORAL
  Administered 2022-08-21: 50 mg via ORAL
  Administered 2022-08-21: 100 mg via ORAL
  Filled 2022-08-20 (×4): qty 2

## 2022-08-20 MED ORDER — MECLIZINE HCL 25 MG PO TABS: 25.0000 mg | ORAL_TABLET | Freq: Three times a day (TID) | ORAL | 0 refills | Status: AC | PRN

## 2022-08-20 NOTE — Progress Notes (Addendum)
PHYSICAL THERAPY  Pt c/o dizziness when assisted to bathroom by NT.  Back in bed feeling nausea as well.  With RN in room, took orthostatic vitals.  BP's in EPIC WNL.  Assisted with amb in hallway, dizziness increases with turns and head motions.  Hx Vertigo/BPPV.  Pt stated, "this happened last time when I take OXY".  Stated he even had to go to the ED after his "other" knee surgery cause the "OXY made my VERTIGO worse".   RN sent a secure chat to PA to ask for pain med change.  Will see pt again later today in hopes to D/C to home later today.  Felecia Shelling  PTA Acute  Rehabilitation Services Office M-F          [phone] Weekend pager [phone]

## 2022-08-20 NOTE — Progress Notes (Signed)
Physical Therapy Treatment Patient Details Name: Howard Morton MRN: [id number] DOB: 12-07-1955 Today's Date: 08/20/2022   History of Present Illness 66 yo male s/p R TKA on 08/19/22. PMH: L TKA 08/07/20, closed reduction L knee 09/2020, OA, BPPV, asthma, anxiety    PT Comments    POD # 1 am session Pt c/o dizziness when assisted to bathroom by NT.  Back in bed feeling nausea as well.  With RN in room, took orthostatic vitals.  BP's in EPIC WNL.  Assisted with amb in hallway, dizziness increases with turns and head motions.  Hx Vertigo/BPPV.  Pt stated, "this happened last time when I take OXY".  Stated he even had to go to the ED after his "other" knee surgery cause the "OXY made my VERTIGO worse".   RN sent a secure chat to PA to ask for pain med change.  Will see pt again this afternoon in hopes to safely D/C to home   Recommendations for follow up therapy are one component of a multi-disciplinary discharge planning process, led by the attending physician.  Recommendations may be updated based on patient status, additional functional criteria and insurance authorization.  Follow Up Recommendations  Follow physician's recommendations for discharge plan and follow up therapies     Assistance Recommended at Discharge Intermittent Supervision/Assistance  Patient can return home with the following Help with stairs or ramp for entrance;Assistance with cooking/housework;Assist for transportation   Equipment Recommendations  None recommended by PT    Recommendations for Other Services       Precautions / Restrictions Precautions Precautions: Fall;Knee Precaution Comments: no pillow under knee Restrictions Weight Bearing Restrictions: No Other Position/Activity Restrictions: WBAT     Mobility  Bed Mobility Overal bed mobility: Needs Assistance Bed Mobility: Supine to Sit     Supine to sit: Min guard     General bed mobility comments: increased time but self able     Transfers Overall transfer level: Needs assistance Equipment used: Rolling walker (2 wheels) Transfers: Sit to/from Stand             General transfer comment: cues for hand placement and safety    Ambulation/Gait Ambulation/Gait assistance: Min guard Gait Distance (Feet): 65 Feet Assistive device: Rolling walker (2 wheels) Gait Pattern/deviations: Step-to pattern, Decreased stance time - right Gait velocity: decreased     General Gait Details: 25% VC's on proper walker to self placement.  Pt reported increased dizziness esp with turns and head movement.  Pt has a Hx VERTIGO.  BP/HR WNL   Stairs             Wheelchair Mobility    Modified Rankin (Stroke Patients Only)       Balance                                            Cognition Arousal/Alertness: Awake/alert Behavior During Therapy: WFL for tasks assessed/performed Overall Cognitive Status: Within Functional Limits for tasks assessed                                 General Comments: AxO x 3 very pleasant and motivated        Exercises      General Comments        Pertinent Vitals/Pain Pain Assessment Pain Assessment: 0-10 Pain Score: 8  Pain Location: right knee Pain Descriptors / Indicators: Aching, Guarding, Grimacing Pain Intervention(s): Monitored during session, Premedicated before session, Repositioned, Ice applied    Home Living                          Prior Function            PT Goals (current goals can now be found in the care plan section) Progress towards PT goals: Progressing toward goals    Frequency    7X/week      PT Plan Current plan remains appropriate    Co-evaluation              AM-PAC PT "6 Clicks" Mobility   Outcome Measure  Help needed turning from your back to your side while in a flat bed without using bedrails?: A Little Help needed moving from lying on your back to sitting on the side of  a flat bed without using bedrails?: A Little Help needed moving to and from a bed to a chair (including a wheelchair)?: A Little Help needed standing up from a chair using your arms (e.g., wheelchair or bedside chair)?: A Little Help needed to walk in hospital room?: A Little Help needed climbing 3-5 steps with a railing? : A Little 6 Click Score: 18    End of Session Equipment Utilized During Treatment: Gait belt Activity Tolerance: Other (comment) (increased dizziness and nausea with activity) Patient left: in chair;with call bell/phone within reach;with chair alarm set Nurse Communication: Mobility status PT Visit Diagnosis: Other abnormalities of gait and mobility (R26.89);Difficulty in walking, not elsewhere classified (R26.2)     Time: 1000-1025 PT Time Calculation (min) (ACUTE ONLY): 25 min  Charges:  $Gait Training: 8-22 mins $Therapeutic Activity: 8-22 mins                     Felecia Shelling  PTA Acute  Rehabilitation Services Office M-F          [phone] Weekend pager [phone]

## 2022-08-20 NOTE — TOC Transition Note (Signed)
Transition of Care [facility]) - CM/SW Discharge Note  Patient Details  Name: Howard Morton MRN: [id number] Date of Birth: February 18, 1956  [facility] CM/SW Contact:  Sherie Don, LCSW Phone Number: 08/20/2022, 10:35 AM  Clinical Narrative: Patient is expected to discharge home after working with PT. CSW met with patient to review discharge plan. Patient will go home with OPPT at [place]. Patient has a rolling walker, cane, rollator, and toilet riser at home so there are no DME needs at this time. TOC signing off.  Final next level of care: OP Rehab Barriers to Discharge: No Barriers Identified  Patient Goals and CMS Choice Patient states their goals for this hospitalization and ongoing recovery are:: Discharge home with OPPT at [place] Choice offered to / list presented to : NA  Discharge Plan and Services      DME Arranged: N/A DME Agency: NA  Social Determinants of Health (SDOH) Interventions Food Insecurity Interventions: Intervention Not Indicated Housing Interventions: Intervention Not Indicated Transportation Interventions: Intervention Not Indicated Utilities Interventions: Intervention Not Indicated  Readmission Risk Interventions     No data to display

## 2022-08-20 NOTE — Progress Notes (Signed)
Subjective: 1 Day Post-Op Procedure(s) (LRB): TOTAL KNEE ARTHROPLASTY (Right) Patient reports pain as mild.   Patient seen in rounds by Dr. Wynelle Link. Patient is well, and has had no acute complaints or problems No issues overnight. Denies chest pain, SOB, or calf pain. Foley catheter removed this AM.  We will continue therapy today, ambulated 19' yesterday.   Objective: Vital signs in last 24 hours: Temp:  [97.5 F (36.4 C)-98.7 F (37.1 C)] 97.5 F (36.4 C) (12/19 0439) Pulse Rate:  [58-87] 79 (12/19 0439) Resp:  [11-20] 20 (12/19 0439) BP: (114-142)/(62-82) 133/70 (12/19 0439) SpO2:  [93 %-99 %] 96 % (12/19 0439)  Intake/Output from previous day:  Intake/Output Summary (Last 24 hours) at 08/20/2022 0816 Last data filed at 08/20/2022 0600 Gross per 24 hour  Intake 2159.84 ml  Output 3600 ml  Net -1440.16 ml     Intake/Output this shift: No intake/output data recorded.  Labs: Recent Labs    08/20/22 0359  HGB 14.3   Recent Labs    08/20/22 0359  WBC 27.9*  RBC 4.79  HCT 44.1  PLT 352   Recent Labs    08/20/22 0359  NA 138  K 4.8  CL 104  CO2 25  BUN 21  CREATININE 1.05  GLUCOSE 132*  CALCIUM 9.0   No results for input(s): "LABPT", "INR" in the last 72 hours.  Exam: General - Patient is Alert and Oriented Extremity - Neurologically intact Neurovascular intact Sensation intact distally Dorsiflexion/Plantar flexion intact Dressing - dressing C/D/I Motor Function - intact, moving foot and toes well on exam.   Past Medical History:  Diagnosis Date   Anxiety    Asthma    Benign paroxysmal positional vertigo    BPH (benign prostatic hyperplasia)    CHI (closed head injury)    Complication of anesthesia    Vertigo after knee surgery   Eczema    ED (erectile dysfunction)    GERD (gastroesophageal reflux disease)    History of kidney stones    Hypercholesterolemia    Hypertension    Insomnia    Osteoarthritis of both knees    Pneumonia     Short-term memory loss    Thrombocytosis     Assessment/Plan: 1 Day Post-Op Procedure(s) (LRB): TOTAL KNEE ARTHROPLASTY (Right) Principal Problem:   Osteoarthritis of right knee  Estimated body mass index is 28.11 kg/m as calculated from the following:   Height as of this encounter: 6' (1.829 m).   Weight as of this encounter: 94 kg. Advance diet Up with therapy D/C IV fluids   Patient's anticipated LOS is less than 2 midnights, meeting these requirements: - Younger than 66 - Lives within 1 hour of care - Has a competent adult at home to recover with post-op recover - NO history of  - Chronic pain requiring opioids  - Diabetes  - Coronary Artery Disease  - Heart failure  - Heart attack  - DVT/VTE  - Cardiac arrhythmia  - Respiratory Failure/COPD  - Renal failure  - Anemia  - Advanced Liver disease  DVT Prophylaxis - Aspirin Weight bearing as tolerated. Continue therapy.  WBC elevated at 27 this morning. Will recheck CBC at 1300, patient with no other symptoms this morning.   Plan is to go Home after hospital stay. Plan for discharge later today if progresses with therapy and meeting goals. Scheduled for OPPT at [facility]). Follow-up in the office in 2 weeks.  The PDMP database was reviewed today prior  to any opioid medications being prescribed to this patient.  Arther Abbott, PA-C Orthopedic Surgery [phone] 08/20/2022, 8:16 AM

## 2022-08-20 NOTE — Progress Notes (Signed)
Physical Therapy Treatment Patient Details Name: Howard Morton MRN: [id number] DOB: November 04, 1955 Today's Date: 08/20/2022   History of Present Illness 66 yo male s/p R TKA on 08/19/22. PMH: L TKA 08/07/20, closed reduction L knee 09/2020, OA, BPPV, asthma, anxiety    PT Comments    POD # 1 pm session Pain Meds were changed from OXY to ULTRAM 50 - 100mg .  Pt also took an LONG nap from 10:30 till after 1pm semi reclined in recliner.  Spouse present during session.  Assisted with amb amb in hallway and practiced stairs.  General Gait Details: tolerated an increased distance.  NO true dizziness but feeling "on the verge".  Had Spouse "hands on" amb pt with caution on turns and minimzing head movement (Hx VERTIGO). Spouse stated, pt sleeps in a recliner because "his Vertigo is bad".  Then returned to room to perform some TE's following HEP handout.  Instructed on proper tech, freq as well as use of ICE.   Pt will need one more night stay to ensure his dizziness/nausea has diminished and pain is controlled.  Recommendations for follow up therapy are one component of a multi-disciplinary discharge planning process, led by the attending physician.  Recommendations may be updated based on patient status, additional functional criteria and insurance authorization.  Follow Up Recommendations  Follow physician's recommendations for discharge plan and follow up therapies     Assistance Recommended at Discharge Intermittent Supervision/Assistance  Patient can return home with the following Help with stairs or ramp for entrance;Assistance with cooking/housework;Assist for transportation   Equipment Recommendations  None recommended by PT    Recommendations for Other Services       Precautions / Restrictions Precautions Precautions: Fall;Knee Precaution Comments: no pillow under knee Restrictions Weight Bearing Restrictions: No Other Position/Activity Restrictions: WBAT     Mobility  Bed  Mobility Overal bed mobility: Needs Assistance Bed Mobility: Sit to Supine     Supine to sit: Min guard Sit to supine: Supervision, Min guard   General bed mobility comments: self able using strap and increased time    Transfers Overall transfer level: Needs assistance Equipment used: Rolling walker (2 wheels) Transfers: Sit to/from Stand             General transfer comment: cues for hand placement and safety    Ambulation/Gait Ambulation/Gait assistance: Min guard Gait Distance (Feet): 74 Feet Assistive device: Rolling walker (2 wheels) Gait Pattern/deviations: Step-to pattern, Decreased stance time - right Gait velocity: decreased     General Gait Details: tolerated an increased distance.  NO true dizziness but feeling "on the verge".  Had Spouse "hands on" amb pt with caution on turns and minimzing head movement (Hx VERTIGO).   Stairs Stairs: Yes Stairs assistance: Min guard, Min assist Stair Management: One rail Right, Step to pattern, Forwards, With walker Number of Stairs: 2 General stair comments: with Spouse "hands on" asissted using safety belt   Wheelchair Mobility    Modified Rankin (Stroke Patients Only)       Balance                                            Cognition Arousal/Alertness: Awake/alert Behavior During Therapy: WFL for tasks assessed/performed Overall Cognitive Status: Within Functional Limits for tasks assessed  General Comments: AxO x 3 very pleasant and motivated.  Mild memory issues S/P TBI (fell off a ladder triming a tree)        Exercises  Total Knee Replacement TE's following HEP handout 10 reps B LE ankle pumps 05 reps towel squeezes 05 reps knee presses 05 reps heel slides  05 reps SAQ's 05 reps SLR's 05 reps ABD Educated on use of gait belt to assist with TE's Followed by ICE     General Comments        Pertinent Vitals/Pain Pain  Assessment Pain Assessment: 0-10 Pain Score: 5  Pain Location: right knee Pain Descriptors / Indicators: Aching, Guarding, Grimacing Pain Intervention(s): Monitored during session, Premedicated before session, Repositioned, Ice applied    Home Living                          Prior Function            PT Goals (current goals can now be found in the care plan section) Progress towards PT goals: Progressing toward goals    Frequency    7X/week      PT Plan Current plan remains appropriate    Co-evaluation              AM-PAC PT "6 Clicks" Mobility   Outcome Measure  Help needed turning from your back to your side while in a flat bed without using bedrails?: A Little Help needed moving from lying on your back to sitting on the side of a flat bed without using bedrails?: A Little Help needed moving to and from a bed to a chair (including a wheelchair)?: A Little Help needed standing up from a chair using your arms (e.g., wheelchair or bedside chair)?: A Little Help needed to walk in hospital room?: A Little Help needed climbing 3-5 steps with a railing? : A Little 6 Click Score: 18    End of Session Equipment Utilized During Treatment: Gait belt Activity Tolerance: Patient tolerated treatment well Patient left: in bed;with bed alarm set;with call bell/phone within reach;with family/visitor present Nurse Communication: Mobility status PT Visit Diagnosis: Other abnormalities of gait and mobility (R26.89);Difficulty in walking, not elsewhere classified (R26.2)     Time: 1350-1415 PT Time Calculation (min) (ACUTE ONLY): 25 min  Charges:  $Gait Training: 8-22 mins $Therapeutic Activity: 8-22 mins                     {Zetta Stoneman  PTA Acute  Rehabilitation Services Office M-F          [phone] Weekend pager [phone]

## 2022-08-20 NOTE — Plan of Care (Signed)
  Problem: Activity: Goal: Ability to avoid complications of mobility impairment will improve Outcome: Progressing   Problem: Pain Management: Goal: Pain level will decrease with appropriate interventions Outcome: Progressing   Problem: Activity: Goal: Risk for activity intolerance will decrease Outcome: Progressing   

## 2022-08-21 DIAGNOSIS — M1711 Unilateral primary osteoarthritis, right knee: Secondary | ICD-10-CM | POA: Diagnosis not present

## 2022-08-21 LAB — CBC
HCT: 44.9 % (ref 39.0–52.0)
HCT: 45.5 % (ref 39.0–52.0)
Hemoglobin: 14.6 g/dL (ref 13.0–17.0)
Hemoglobin: 14.5 g/dL (ref 13.0–17.0)
MCH: 29.4 pg (ref 26.0–34.0)
MCH: 29.6 pg (ref 26.0–34.0)
MCHC: 32.5 g/dL (ref 30.0–36.0)
MCHC: 31.9 g/dL (ref 30.0–36.0)
MCV: 90.3 fL (ref 80.0–100.0)
MCV: 92.9 fL (ref 80.0–100.0)
Platelets: 362 10*3/uL (ref 150–400)
Platelets: 345 10*3/uL (ref 150–400)
RBC: 4.97 MIL/uL (ref 4.22–5.81)
RBC: 4.9 MIL/uL (ref 4.22–5.81)
RDW: 13.5 % (ref 11.5–15.5)
RDW: 13.6 % (ref 11.5–15.5)
WBC: 29.3 10*3/uL — ABNORMAL HIGH (ref 4.0–10.5)
WBC: 27.1 10*3/uL — ABNORMAL HIGH (ref 4.0–10.5)
nRBC: 0 % (ref 0.0–0.2)
nRBC: 0 % (ref 0.0–0.2)

## 2022-08-21 MED ORDER — HYDROMORPHONE HCL 2 MG PO TABS: 2.0000 mg | ORAL_TABLET | ORAL | Status: DC | PRN

## 2022-08-21 MED ORDER — TRAMADOL HCL 50 MG PO TABS: 50.0000 mg | ORAL_TABLET | Freq: Four times a day (QID) | ORAL | 0 refills | Status: AC | PRN

## 2022-08-21 MED ORDER — HYDROMORPHONE HCL 2 MG PO TABS: 2.0000 mg | ORAL_TABLET | Freq: Four times a day (QID) | ORAL | 0 refills | Status: AC | PRN

## 2022-08-21 NOTE — Discharge Summary (Signed)
Physician Discharge Summary   Patient ID: Howard Morton MRN: [id number] DOB/AGE: 03-15-1956 66 y.o.  Admit date: 08/19/2022 Discharge date: 08/21/2022  Primary Diagnosis: Osteoarthritis, right knee   Admission Diagnoses:  Past Medical History:  Diagnosis Date   Anxiety    Asthma    Benign paroxysmal positional vertigo    BPH (benign prostatic hyperplasia)    CHI (closed head injury)    Complication of anesthesia    Vertigo after knee surgery   Eczema    ED (erectile dysfunction)    GERD (gastroesophageal reflux disease)    History of kidney stones    Hypercholesterolemia    Hypertension    Insomnia    Osteoarthritis of both knees    Pneumonia    Short-term memory loss    Thrombocytosis    Discharge Diagnoses:   Principal Problem:   Osteoarthritis of right knee  Estimated body mass index is 28.11 kg/m as calculated from the following:   Height as of this encounter: 6' (1.829 m).   Weight as of this encounter: 94 kg.  Procedure:  Procedure(s) (LRB): TOTAL KNEE ARTHROPLASTY (Right)   Consults: None  HPI: Howard Morton is a 66 y.o. year old male with end stage OA of his right knee with progressively worsening pain and dysfunction. He has constant pain, with activity and at rest and significant functional deficits with difficulties even with ADLs. He has had extensive non-op management including analgesics, injections of cortisone and viscosupplements, and home exercise program, but remains in significant pain with significant dysfunction. Radiographs show bone on bone arthritis medial and patellofemoral. He presents now for right Total Knee Arthroplasty.  Laboratory Data: Admission on 08/19/2022, Discharged on 08/21/2022  Component Date Value Ref Range Status   WBC 08/20/2022 27.9 (H)  4.0 - 10.5 K/uL Final   RBC 08/20/2022 4.79  4.22 - 5.81 MIL/uL Final   Hemoglobin 08/20/2022 14.3  13.0 - 17.0 g/dL Final   HCT 02/58/5277 44.1  39.0 - 52.0 % Final   MCV  08/20/2022 92.1  80.0 - 100.0 fL Final   MCH 08/20/2022 29.9  26.0 - 34.0 pg Final   MCHC 08/20/2022 32.4  30.0 - 36.0 g/dL Final   RDW 82/42/3536 13.4  11.5 - 15.5 % Final   Platelets 08/20/2022 352  150 - 400 K/uL Final   nRBC 08/20/2022 0.0  0.0 - 0.2 % Final   Performed at [facility], [address] [place], [place] [address]   Sodium 08/20/2022 138  135 - 145 mmol/L Final   Potassium 08/20/2022 4.8  3.5 - 5.1 mmol/L Final   Chloride 08/20/2022 104  98 - 111 mmol/L Final   CO2 08/20/2022 25  22 - 32 mmol/L Final   Glucose, Bld 08/20/2022 132 (H)  70 - 99 mg/dL Final   Glucose reference range applies only to samples taken after fasting for at least 8 hours.   BUN 08/20/2022 21  8 - 23 mg/dL Final   Creatinine, Ser 08/20/2022 1.05  0.61 - 1.24 mg/dL Final   Calcium 54/00/8676 9.0  8.9 - 10.3 mg/dL Final   GFR, Estimated 08/20/2022 >60  >60 mL/min Final   Comment: (NOTE) Calculated using the CKD-EPI Creatinine Equation (2021)    Anion gap 08/20/2022 9  5 - 15 Final   Performed at [facility], [address], [place], [place] [address]   WBC 08/20/2022 27.0 (H)  4.0 - 10.5 K/uL Final   RBC 08/20/2022 4.88  4.22 -  5.81 MIL/uL Final   Hemoglobin 08/20/2022 14.5  13.0 - 17.0 g/dL Final   HCT 76/19/5093 45.0  39.0 - 52.0 % Final   MCV 08/20/2022 92.2  80.0 - 100.0 fL Final   MCH 08/20/2022 29.7  26.0 - 34.0 pg Final   MCHC 08/20/2022 32.2  30.0 - 36.0 g/dL Final   RDW 26/71/2458 13.6  11.5 - 15.5 % Final   Platelets 08/20/2022 355  150 - 400 K/uL Final   nRBC 08/20/2022 0.0  0.0 - 0.2 % Final   Performed at [facility], [address], [place], [place] [address]   WBC 08/21/2022 29.3 (H)  4.0 - 10.5 K/uL Final   RBC 08/21/2022 4.97  4.22 - 5.81 MIL/uL Final   Hemoglobin 08/21/2022 14.6  13.0 - 17.0 g/dL Final   HCT 38/25/0539 44.9  39.0 - 52.0 % Final   MCV 08/21/2022 90.3  80.0 - 100.0 fL Final   MCH 08/21/2022 29.4   26.0 - 34.0 pg Final   MCHC 08/21/2022 32.5  30.0 - 36.0 g/dL Final   RDW 76/73/4193 13.5  11.5 - 15.5 % Final   Platelets 08/21/2022 362  150 - 400 K/uL Final   nRBC 08/21/2022 0.0  0.0 - 0.2 % Final   Performed at [facility], [address] [place], [place] [address]   WBC 08/21/2022 27.1 (H)  4.0 - 10.5 K/uL Final   RBC 08/21/2022 4.90  4.22 - 5.81 MIL/uL Final   Hemoglobin 08/21/2022 14.5  13.0 - 17.0 g/dL Final   HCT 09/73/5329 45.5  39.0 - 52.0 % Final   MCV 08/21/2022 92.9  80.0 - 100.0 fL Final   MCH 08/21/2022 29.6  26.0 - 34.0 pg Final   MCHC 08/21/2022 31.9  30.0 - 36.0 g/dL Final   RDW 92/42/6834 13.6  11.5 - 15.5 % Final   Platelets 08/21/2022 345  150 - 400 K/uL Final   nRBC 08/21/2022 0.0  0.0 - 0.2 % Final   Performed at [facility], [address], [place], [place] [address]  Hospital Outpatient Visit on 08/08/2022  Component Date Value Ref Range Status   MRSA, PCR 08/08/2022 NEGATIVE  NEGATIVE Final   Staphylococcus aureus 08/08/2022 NEGATIVE  NEGATIVE Final   Comment: (NOTE) The Xpert SA Assay (FDA approved for NASAL specimens in patients 59 years of age and older), is one component of a comprehensive surveillance program. It is not intended to diagnose infection nor to guide or monitor treatment. Performed at [facility], [address] [place], [place] [address]    Sodium 08/08/2022 138  135 - 145 mmol/L Final   Potassium 08/08/2022 4.6  3.5 - 5.1 mmol/L Final   Chloride 08/08/2022 104  98 - 111 mmol/L Final   CO2 08/08/2022 26  22 - 32 mmol/L Final   Glucose, Bld 08/08/2022 101 (H)  70 - 99 mg/dL Final   Glucose reference range applies only to samples taken after fasting for at least 8 hours.   BUN 08/08/2022 20  8 - 23 mg/dL Final   Creatinine, Ser 08/08/2022 1.06  0.61 - 1.24 mg/dL Final   Calcium 92/07/9416 9.6  8.9 - 10.3 mg/dL Final   GFR, Estimated 08/08/2022 >60  >60 mL/min Final    Comment: (NOTE) Calculated using the CKD-EPI Creatinine Equation (2021)    Anion gap 08/08/2022 8  5 - 15 Final   Performed at [facility], [address]  [place], [place] [address]   WBC 08/08/2022 10.3  4.0 - 10.5 K/uL Final   RBC 08/08/2022 5.40  4.22 - 5.81 MIL/uL Final   Hemoglobin 08/08/2022 16.0  13.0 - 17.0 g/dL Final   HCT 60/45/409812/03/2022 49.3  39.0 - 52.0 % Final   MCV 08/08/2022 91.3  80.0 - 100.0 fL Final   MCH 08/08/2022 29.6  26.0 - 34.0 pg Final   MCHC 08/08/2022 32.5  30.0 - 36.0 g/dL Final   RDW 11/91/478212/03/2022 13.3  11.5 - 15.5 % Final   Platelets 08/08/2022 388  150 - 400 K/uL Final   nRBC 08/08/2022 0.0  0.0 - 0.2 % Final   Performed at [facility], [address] [place], [place] [address]     X-Rays:No results found.  EKG: Orders placed or performed during the hospital encounter of 08/08/22   EKG 12 lead per protocol   EKG 12 lead per protocol     Hospital Course: Howard Morton is a 66 y.o. who was admitted to [facility]. They were brought to the operating room on 08/19/2022 and underwent Procedure(s): TOTAL KNEE ARTHROPLASTY.  Patient tolerated the procedure well and was later transferred to the recovery room and then to the orthopaedic floor for postoperative care. They were given PO and IV analgesics for pain control following their surgery. They were given 24 hours of postoperative antibiotics of  Anti-infectives (From admission, onward)    Start     Dose/Rate Route Frequency Ordered Stop   08/19/22 1230  ceFAZolin (ANCEF) IVPB 2g/100 mL premix        2 g 200 mL/hr over 30 Minutes Intravenous Every 6 hours 08/19/22 1136 08/19/22 1753   08/19/22 0600  ceFAZolin (ANCEF) IVPB 2g/100 mL premix  Status:  Discontinued        2 g 200 mL/hr over 30 Minutes Intravenous On call to O.R. 08/19/22 0545 08/19/22 1128      and started on DVT prophylaxis in the form of Aspirin.   PT and OT were ordered for total joint  protocol. Discharge planning consulted to help with post-op disposition and equipment needs. Patient had a fair night on the evening of surgery. They started to get up OOB with physical therapy on POD #0. Continued to work with physical therapy into POD #2. Patient was seen during rounds on day two and was ready to go home pending progress with physical therapy. Patient worked with physical therapy for a total of 4 sessions and was meeting their goals. Dressing was changed and the incision was C/D/I.  They were discharged home later that day in stable condition.  Diet: Regular diet Activity: WBAT Follow-up: in 2 weeks Disposition: Home Discharged Condition: stable   Discharge Instructions     Call MD / Call 911   Complete by: As directed    If you experience chest pain or shortness of breath, CALL 911 and be transported to the hospital emergency room.  If you develope a fever above 101 F, pus (white drainage) or increased drainage or redness at the wound, or calf pain, call your surgeon's office.   Change dressing   Complete by: As directed    You may remove the bulky bandage (ACE wrap and gauze) two days after surgery. You will have an adhesive waterproof bandage underneath. Leave this in place until your first follow-up appointment.   Constipation Prevention   Complete by: As directed    Drink plenty of fluids.  Prune juice may  be helpful.  You may use a stool softener, such as Colace (over the counter) 100 mg twice a day.  Use MiraLax (over the counter) for constipation as needed.   Diet - low sodium heart healthy   Complete by: As directed    Do not put a pillow under the knee. Place it under the heel.   Complete by: As directed    Driving restrictions   Complete by: As directed    No driving for two weeks   Post-operative opioid taper instructions:   Complete by: As directed    POST-OPERATIVE OPIOID TAPER INSTRUCTIONS: It is important to wean off of your opioid medication as soon  as possible. If you do not need pain medication after your surgery it is ok to stop day one. Opioids include: Codeine, Hydrocodone(Norco, Vicodin), Oxycodone(Percocet, oxycontin) and hydromorphone amongst others.  Long term and even short term use of opiods can cause: Increased pain response Dependence Constipation Depression Respiratory depression And more.  Withdrawal symptoms can include Flu like symptoms Nausea, vomiting And more Techniques to manage these symptoms Hydrate well Eat regular healthy meals Stay active Use relaxation techniques(deep breathing, meditating, yoga) Do Not substitute Alcohol to help with tapering If you have been on opioids for less than two weeks and do not have pain than it is ok to stop all together.  Plan to wean off of opioids This plan should start within one week post op of your joint replacement. Maintain the same interval or time between taking each dose and first decrease the dose.  Cut the total daily intake of opioids by one tablet each day Next start to increase the time between doses. The last dose that should be eliminated is the evening dose.      TED hose   Complete by: As directed    Use stockings (TED hose) for three weeks on both leg(s).  You may remove them at night for sleeping.   Weight bearing as tolerated   Complete by: As directed       Allergies as of 08/21/2022       Reactions   Depakote [divalproex Sodium] Rash   Valproic Acid Rash        Medication List     STOP taking these medications    diclofenac Sodium 1 % Gel Commonly known as: VOLTAREN   naproxen sodium 220 MG tablet Commonly known as: ALEVE       TAKE these medications    acetaminophen 500 MG tablet Commonly known as: TYLENOL Take 1,000 mg by mouth every 6 (six) hours as needed for moderate pain.   albuterol 108 (90 Base) MCG/ACT inhaler Commonly known as: VENTOLIN HFA Inhale 2 puffs into the lungs every 6 (six) hours as needed for  wheezing or shortness of breath.   aspirin EC 325 MG tablet Take 1 tablet (325 mg total) by mouth 2 (two) times daily for 20 days. Then resume one 81 mg aspirin once a day. What changed:  medication strength how much to take when to take this additional instructions   gabapentin 300 MG capsule Commonly known as: NEURONTIN Take 600 mg by mouth 2 (two) times daily.   hydrocortisone 2.5 % lotion Apply 1 application  topically as needed (eczema).   HYDROmorphone 2 MG tablet Commonly known as: DILAUDID Take 1-2 tablets (2-4 mg total) by mouth every 6 (six) hours as needed for severe pain.   loratadine 10 MG tablet Commonly known as: CLARITIN Take 10 mg by  mouth daily.   LORazepam 0.5 MG tablet Commonly known as: ATIVAN Take 0.5 mg by mouth daily as needed for anxiety.   meclizine 25 MG tablet Commonly known as: ANTIVERT Take 25 mg by mouth 4 (four) times daily as needed for dizziness. What changed: Another medication with the same name was added. Make sure you understand how and when to take each.   meclizine 25 MG tablet Commonly known as: ANTIVERT Take 1 tablet (25 mg total) by mouth 3 (three) times daily as needed for dizziness. What changed: You were already taking a medication with the same name, and this prescription was added. Make sure you understand how and when to take each.   methocarbamol 500 MG tablet Commonly known as: ROBAXIN Take 1 tablet (500 mg total) by mouth every 6 (six) hours as needed for muscle spasms.   ondansetron 4 MG disintegrating tablet Commonly known as: ZOFRAN-ODT Take 1 tablet (4 mg total) by mouth every 8 (eight) hours as needed for nausea or vomiting.   traMADol 50 MG tablet Commonly known as: ULTRAM Take 1-2 tablets (50-100 mg total) by mouth every 6 (six) hours as needed (mild pain).   Wixela Inhub 100-50 MCG/ACT Aepb Generic drug: fluticasone-salmeterol Inhale 1 puff into the lungs 2 (two) times daily.   zinc gluconate 50 MG  tablet Take 50 mg by mouth daily.               Discharge Care Instructions  (From admission, onward)           Start     Ordered   08/20/22 0000  Weight bearing as tolerated        08/20/22 0820   08/20/22 0000  Change dressing       Comments: You may remove the bulky bandage (ACE wrap and gauze) two days after surgery. You will have an adhesive waterproof bandage underneath. Leave this in place until your first follow-up appointment.   08/20/22 0820            Follow-up Information     Ollen Gross, MD. Schedule an appointment as soon as possible for a visit in 2 week(s).   Specialty: Orthopedic Surgery Contact information: [address] [place] [address] [place] [address] [phone]                 Signed: R. Arcola Jansky, PA-C Orthopedic Surgery 08/21/2022, 3:55 PM

## 2022-08-21 NOTE — Progress Notes (Signed)
   Subjective: 2 Days Post-Op Procedure(s) (LRB): TOTAL KNEE ARTHROPLASTY (Right) Patient seen in rounds by Dr. Lequita Halt. Patient is well, and has had no acute complaints or problems. Had issues with vertigo yesterday but reports this is improving. Denies N/V. Denies dizziness. Denies SOB or chest pain. Denies calf pain. Patient reports pain as mild.    Objective: Vital signs in last 24 hours: Temp:  [98.1 F (36.7 C)-98.6 F (37 C)] 98.1 F (36.7 C) (12/20 0624) Pulse Rate:  [76-81] 77 (12/20 0624) Resp:  [15-18] 15 (12/20 0624) BP: (153-175)/(74-83) 175/83 (12/20 0624) SpO2:  [93 %-96 %] 96 % (12/20 0814)  Intake/Output from previous day:  Intake/Output Summary (Last 24 hours) at 08/21/2022 0822 Last data filed at 08/21/2022 4580 Gross per 24 hour  Intake 406.75 ml  Output 1525 ml  Net -1118.25 ml    Intake/Output this shift: No intake/output data recorded.  Labs: Recent Labs    08/20/22 0359 08/20/22 1242 08/21/22 0349  HGB 14.3 14.5 14.6   Recent Labs    08/20/22 1242 08/21/22 0349  WBC 27.0* 29.3*  RBC 4.88 4.97  HCT 45.0 44.9  PLT 355 362   Recent Labs    08/20/22 0359  NA 138  K 4.8  CL 104  CO2 25  BUN 21  CREATININE 1.05  GLUCOSE 132*  CALCIUM 9.0   No results for input(s): "LABPT", "INR" in the last 72 hours.  Exam: General - Patient is Alert and Oriented Extremity - Neurologically intact Neurovascular intact Sensation intact distally Dorsiflexion/Plantar flexion intact Dressing/Incision - clean, dry, no drainage Motor Function - intact, moving foot and toes well on exam.  Past Medical History:  Diagnosis Date   Anxiety    Asthma    Benign paroxysmal positional vertigo    BPH (benign prostatic hyperplasia)    CHI (closed head injury)    Complication of anesthesia    Vertigo after knee surgery   Eczema    ED (erectile dysfunction)    GERD (gastroesophageal reflux disease)    History of kidney stones    Hypercholesterolemia     Hypertension    Insomnia    Osteoarthritis of both knees    Pneumonia    Short-term memory loss    Thrombocytosis     Assessment/Plan: 2 Days Post-Op Procedure(s) (LRB): TOTAL KNEE ARTHROPLASTY (Right) Principal Problem:   Osteoarthritis of right knee  Estimated body mass index is 28.11 kg/m as calculated from the following:   Height as of this encounter: 6' (1.829 m).   Weight as of this encounter: 94 kg.  DVT Prophylaxis - Aspirin Weight-bearing as tolerated.  WBC elevated to 29.3 but remains asymptomatic. Will repeat once more this PM.  Continue with physical therapy. Expected discharge today pending progress and symptom management. Scheduled for OPPT at [facility]. Follow-up in clinic in 2 weeks.  Alfonzo Feller, PA-C Orthopedic Surgery [phone] 08/21/2022, 8:22 AM

## 2022-08-21 NOTE — Progress Notes (Signed)
Physical Therapy Treatment Patient Details Name: Howard Morton MRN: [id number] DOB: 1955-11-05 Today's Date: 08/21/2022   History of Present Illness 66 yo male s/p R TKA on 08/19/22. PMH: L TKA 08/07/20, closed reduction L knee 09/2020, OA, BPPV, asthma, anxiety    PT Comments    POD # 2 am session Pt doing and feeling "better" with med change.  General bed mobility comments: self able using strap and increased time.  General transfer comment: good use of hands to steady self.  Allowes increased time due to Hx Vertigo.  Pt knows to fixate forward. General Gait Details: tolerated an increased distance feeling "better".  Amb with Spouse "hands on" using safety belt.  Pain controlled 4/10. General stair comments: with Spouse "hands on" asissted using safety belt.  Perfomed twice. Then returned to room to perform some TE's following HEP handout.  Instructed on proper tech, freq as well as use of ICE.   Addressed all mobility questions, discussed appropriate activity, educated on use of ICE.  Pt ready for D/C to home.   Recommendations for follow up therapy are one component of a multi-disciplinary discharge planning process, led by the attending physician.  Recommendations may be updated based on patient status, additional functional criteria and insurance authorization.  Follow Up Recommendations  Follow physician's recommendations for discharge plan and follow up therapies     Assistance Recommended at Discharge    Patient can return home with the following Help with stairs or ramp for entrance;Assistance with cooking/housework;Assist for transportation   Equipment Recommendations  None recommended by PT    Recommendations for Other Services       Precautions / Restrictions Precautions Precautions: Fall;Knee Precaution Comments: no pillow under knee Restrictions Weight Bearing Restrictions: No Other Position/Activity Restrictions: WBAT     Mobility  Bed Mobility Overal bed  mobility: Needs Assistance Bed Mobility: Supine to Sit     Supine to sit: Supervision     General bed mobility comments: self able using strap and increased time    Transfers Overall transfer level: Needs assistance Equipment used: Rolling walker (2 wheels) Transfers: Sit to/from Stand Sit to Stand: Supervision           General transfer comment: good use of hands to steady self.  Allowes increased time due to Hx Vertigo.  Pt knows to fixate forward.    Ambulation/Gait Ambulation/Gait assistance: Supervision, Min guard Gait Distance (Feet): 95 Feet Assistive device: Rolling walker (2 wheels) Gait Pattern/deviations: Step-to pattern, Decreased stance time - right Gait velocity: decreased     General Gait Details: tolerated an increased distance feeling "better".  Amb with Spouse "hands on" using safety belt.  Pain controlled 4/10.   Stairs Stairs: Yes Stairs assistance: Supervision, Min guard Stair Management: One rail Right, Step to pattern, Forwards, With walker Number of Stairs: 2 General stair comments: with Spouse "hands on" asissted using safety belt.  Perfomed twice.   Wheelchair Mobility    Modified Rankin (Stroke Patients Only)       Balance                                            Cognition Arousal/Alertness: Awake/alert Behavior During Therapy: WFL for tasks assessed/performed  General Comments: AxO x 3 very pleasant and motivated.  Mild memory issues S/P TBI (fell off a ladder triming a tree)        Exercises  Total Knee Replacement TE's following HEP handout 10 reps B LE ankle pumps 05 reps towel squeezes 05 reps knee presses 05 reps heel slides  05 reps SAQ's 05 reps SLR's 05 reps ABD Educated on use of gait belt to assist with TE's Followed by ICE     General Comments        Pertinent Vitals/Pain Pain Assessment Pain Assessment: 0-10 Pain Score: 4  Pain  Location: right knee Pain Descriptors / Indicators: Aching, Guarding, Grimacing Pain Intervention(s): Monitored during session, Premedicated before session, Repositioned, Ice applied    Home Living                          Prior Function            PT Goals (current goals can now be found in the care plan section) Progress towards PT goals: Progressing toward goals    Frequency    7X/week      PT Plan Current plan remains appropriate    Co-evaluation              AM-PAC PT "6 Clicks" Mobility   Outcome Measure  Help needed turning from your back to your side while in a flat bed without using bedrails?: A Little Help needed moving from lying on your back to sitting on the side of a flat bed without using bedrails?: A Little Help needed moving to and from a bed to a chair (including a wheelchair)?: A Little Help needed standing up from a chair using your arms (e.g., wheelchair or bedside chair)?: A Little Help needed to walk in hospital room?: A Little Help needed climbing 3-5 steps with a railing? : A Little 6 Click Score: 18    End of Session Equipment Utilized During Treatment: Gait belt Activity Tolerance: Patient tolerated treatment well Patient left: in bed;with bed alarm set;with call bell/phone within reach;with family/visitor present Nurse Communication: Mobility status PT Visit Diagnosis: Other abnormalities of gait and mobility (R26.89);Difficulty in walking, not elsewhere classified (R26.2)     Time: [id number] PT Time Calculation (min) (ACUTE ONLY): 30 min  Charges:  $Gait Training: 8-22 mins $Therapeutic Exercise: 8-22 mins                     Rica Koyanagi  PTA [facility] M-F          [phone] Weekend pager [phone]

## 2022-08-21 NOTE — Plan of Care (Signed)
Plan of care reviewed and discussed. °

## 2022-09-17 ENCOUNTER — Other Ambulatory Visit (HOSPITAL_COMMUNITY): Payer: Medicare HMO
